# Patient Record
Sex: Male | Born: 1980 | Race: White | Hispanic: No | Smoking: Current every day smoker
Health system: Southern US, Community
[De-identification: ages and names within clinical notes are randomized; demographics above are authoritative.]

## PROBLEM LIST (undated history)

## (undated) DIAGNOSIS — J939 Pneumothorax, unspecified: Secondary | ICD-10-CM

## (undated) DIAGNOSIS — G43909 Migraine, unspecified, not intractable, without status migrainosus: Secondary | ICD-10-CM

## (undated) HISTORY — PX: OTHER SURGICAL HISTORY: SHX169

## (undated) HISTORY — PX: APPENDECTOMY: SHX54

---

## 2007-02-05 ENCOUNTER — Emergency Department (HOSPITAL_COMMUNITY): Admission: EM | Admit: 2007-02-05 | Discharge: 2007-02-05 | Payer: Self-pay | Admitting: Emergency Medicine

## 2007-02-07 ENCOUNTER — Emergency Department (HOSPITAL_COMMUNITY): Admission: EM | Admit: 2007-02-07 | Discharge: 2007-02-08 | Payer: Self-pay | Admitting: Emergency Medicine

## 2011-12-17 ENCOUNTER — Encounter (HOSPITAL_COMMUNITY): Payer: Self-pay | Admitting: Emergency Medicine

## 2011-12-17 ENCOUNTER — Emergency Department (HOSPITAL_COMMUNITY)
Admission: EM | Admit: 2011-12-17 | Discharge: 2011-12-18 | Disposition: A | Discharge status: Home / Self Care | Payer: Self-pay | Attending: Emergency Medicine | Admitting: Emergency Medicine

## 2011-12-17 DIAGNOSIS — R112 Nausea with vomiting, unspecified: Secondary | ICD-10-CM | POA: Insufficient documentation

## 2011-12-17 DIAGNOSIS — G43909 Migraine, unspecified, not intractable, without status migrainosus: Secondary | ICD-10-CM | POA: Insufficient documentation

## 2011-12-17 MED ORDER — PROMETHAZINE HCL 25 MG/ML IJ SOLN
25.0000 mg | Freq: Once | INTRAMUSCULAR | Status: AC
  Administered 2011-12-17: 25 mg via INTRAVENOUS
  Filled 2011-12-17: qty 1

## 2011-12-17 MED ORDER — DIPHENHYDRAMINE HCL 50 MG/ML IJ SOLN
25.0000 mg | Freq: Once | INTRAMUSCULAR | Status: AC
  Administered 2011-12-17: 25 mg via INTRAVENOUS
  Filled 2011-12-17: qty 1

## 2011-12-17 MED ORDER — SODIUM CHLORIDE 0.9 % IV BOLUS (SEPSIS)
2000.0000 mL | Freq: Once | INTRAVENOUS | Status: AC
  Administered 2011-12-17: 1000 mL via INTRAVENOUS

## 2011-12-17 MED ORDER — HYDROMORPHONE HCL PF 1 MG/ML IJ SOLN
1.0000 mg | Freq: Once | INTRAMUSCULAR | Status: AC
  Administered 2011-12-17: 1 mg via INTRAVENOUS
  Filled 2011-12-17: qty 1

## 2011-12-17 MED ORDER — METHYLPREDNISOLONE SODIUM SUCC 125 MG IJ SOLR
125.0000 mg | Freq: Once | INTRAMUSCULAR | Status: AC
  Administered 2011-12-17: 125 mg via INTRAVENOUS
  Filled 2011-12-17: qty 2

## 2011-12-17 NOTE — ED Provider Notes (Addendum)
History  This chart was scribed for Felisa Bonier, MD by Bennett Scrape. This patient was seen in room [facility] and the patient's care was started at 10:03PM.  CSN: [id number]  Arrival date & time 12/17/11  2058   First MD Initiated Contact with Patient 12/17/11 2150      Chief Complaint  Patient presents with  . Migraine  . Nausea    The history is provided by the patient. No language interpreter was used.    Mario Bautista is a 31 y.o. male with a h/o HA who presents to the Emergency Department complaining of 12 hours of gradual onset, gradually worsening, constant HA. The HA is described as a throbbing feeling and is located diffusely about the head. He lists nausea, emesis and dizziness with standing as associated symptoms. He states that the HA is worse with bright lights. He states that he has tried a plethora of medications at home with no improvement in symptoms. He denies head injury as the cause of the HA. He denies diarrhea, fever, abdominal pain, sore throat, cough and chest pain as associated symptoms. He has no other h/o chronic medical conditions. He is a current everyday smoker but denies alcohol use.   History reviewed. No pertinent past medical history.  History reviewed. No pertinent past surgical history.  No family history on file.  History  Substance Use Topics  . Smoking status: Current Everyday Smoker -- 0.5 packs/day for 15 years    Types: Cigarettes  . Smokeless tobacco: Not on file  . Alcohol Use: No      Review of Systems  Constitutional: Positive for chills. Negative for fever.  HENT: Negative for congestion, sore throat and neck pain.   Eyes: Positive for photophobia. Negative for pain.  Respiratory: Negative for cough and shortness of breath.   Cardiovascular: Negative for chest pain.  Gastrointestinal: Positive for nausea and vomiting. Negative for abdominal pain and diarrhea.  Genitourinary: Negative for dysuria, urgency and  hematuria.  Musculoskeletal: Negative for back pain.  Skin: Negative for rash.  Neurological: Positive for headaches. Negative for seizures.  Psychiatric/Behavioral: Negative for confusion.    Allergies  Penicillins and Imitrex  Home Medications  No current outpatient prescriptions on file.  Triage Vitals: BP 118/88  Pulse 97  Temp(Src) 98 F (36.7 C) (Oral)  Resp 20  Ht 6' (1.829 m)  Wt 135 lb (61.236 kg)  BMI 18.31 kg/m2  SpO2 99%  Physical Exam  Nursing note and vitals reviewed. Constitutional: He is oriented to person, place, and time. He appears well-developed and well-nourished. He appears distressed.  HENT:  Head: Normocephalic and atraumatic. Head is without raccoon's eyes, without Battle's sign, without abrasion, without contusion, without right periorbital erythema and without left periorbital erythema.  Right Ear: Hearing, tympanic membrane, external ear and ear canal normal.  Left Ear: Hearing, tympanic membrane, external ear and ear canal normal.  Nose: No mucosal edema, rhinorrhea, sinus tenderness, nasal deformity, septal deviation or nasal septal hematoma. No epistaxis. Right sinus exhibits no maxillary sinus tenderness and no frontal sinus tenderness. Left sinus exhibits no maxillary sinus tenderness and no frontal sinus tenderness.  Mouth/Throat: Uvula is midline, oropharynx is clear and moist and mucous membranes are normal. No oropharyngeal exudate.       Moist, normal oro  Eyes: Conjunctivae, EOM and lids are normal. Pupils are equal, round, and reactive to light. Right eye exhibits no chemosis, no discharge, no exudate and no hordeolum. No foreign body present in  the right eye. Left eye exhibits no chemosis, no discharge, no exudate and no hordeolum. No foreign body present in the left eye. Right conjunctiva is not injected. Right conjunctiva has no hemorrhage. Left conjunctiva is not injected. Left conjunctiva has no hemorrhage. Right eye exhibits normal  extraocular motion and no nystagmus. Left eye exhibits normal extraocular motion and no nystagmus.  Fundoscopic exam:      The right eye shows no AV nicking, no exudate, no hemorrhage and no papilledema.       The left eye shows no AV nicking, no exudate, no hemorrhage and no papilledema.       No papil edema  Neck: Trachea normal, normal range of motion, full passive range of motion without pain and phonation normal. Neck supple. No JVD present. No tracheal tenderness, no spinous process tenderness and no muscular tenderness present. Carotid bruit is not present. No rigidity. No tracheal deviation, no edema, no erythema and normal range of motion present. No Brudzinski's sign noted. No mass and no thyromegaly present.       No nuchal rigidity  Cardiovascular: Normal rate, regular rhythm, normal heart sounds and intact distal pulses.  Exam reveals no gallop and no friction rub.   No murmur heard. Pulmonary/Chest: Effort normal and breath sounds normal. No stridor. No respiratory distress. He has no wheezes. He has no rales.       Lungs are clear to ausculation, no rhonchi noted  Abdominal: Soft. Bowel sounds are normal. He exhibits no distension and no mass. There is no tenderness. There is no rebound and no guarding.  Musculoskeletal: Normal range of motion. He exhibits no edema and no tenderness.  Neurological: He is alert and oriented to person, place, and time. He has normal reflexes. He displays no atrophy, no tremor and normal reflexes. No cranial nerve deficit or sensory deficit. He exhibits normal muscle tone. He displays a negative Romberg sign. He displays no seizure activity. Coordination and gait normal. GCS eye subscore is 4. GCS verbal subscore is 5. GCS motor subscore is 6.  Reflex Scores:      Bicep reflexes are 2+ on the right side and 2+ on the left side.      Brachioradialis reflexes are 2+ on the right side and 2+ on the left side.      Patellar reflexes are 2+ on the right side  and 2+ on the left side.      Normal coordination with finger to nose to finger test  Skin: Skin is warm and dry. No rash noted. He is not diaphoretic. No erythema. No pallor.  Psychiatric: He has a normal mood and affect. His behavior is normal. Judgment and thought content normal.    ED Course  Procedures (including critical care time)  DIAGNOSTIC STUDIES: Oxygen Saturation is 99% on room air, normal by my interpretation.    COORDINATION OF CARE: 10:07PM-Discussed treatment plan with pt and pt agreed to plan.  Labs Reviewed - No data to display No results found.   No diagnosis found.    MDM  The patient has a gradual onset, generalized headache without focal neurologic symptoms or findings otherwise to suggest acute hemorrhage or mass lesion in the brain or to suggest need for CT scan of the head.  I will attempt to alleviate the patient's symptoms in the ED prior to discharge home.  The patient states his understanding of and agreement with the plan of care.      I personally performed the  services described in this documentation, which was scribed in my presence. The recorded information has been reviewed and considered.    Felisa Bonier, MD 12/17/11 2305  12:22 AM The patient reports no further nausea/vomiting, and that his headache is now an intensity of 1/10, all but gone.  He feels well enough for discharge home to rest.  Felisa Bonier, MD 12/18/11 [phone]

## 2011-12-17 NOTE — ED Notes (Signed)
Pt with cc of headache with nausea and chills starting this am

## 2011-12-18 MED ORDER — IBUPROFEN 800 MG PO TABS: 800.0000 mg | ORAL_TABLET | Freq: Three times a day (TID) | ORAL | Status: AC | PRN

## 2011-12-18 MED ORDER — PROMETHAZINE HCL 25 MG PO TABS: 25.0000 mg | ORAL_TABLET | Freq: Four times a day (QID) | ORAL | Status: DC | PRN

## 2011-12-18 NOTE — Discharge Instructions (Signed)
Headaches, Frequently Asked Questions MIGRAINE HEADACHES Q: What is migraine? What causes it? How can I treat it? A: Generally, migraine headaches begin as a dull ache. Then they develop into a constant, throbbing, and pulsating pain. You may experience pain at the temples. You may experience pain at the front or back of one or both sides of the head. The pain is usually accompanied by a combination of:  Nausea.   Vomiting.   Sensitivity to light and noise.  Some people (about 15%) experience an aura (see below) before an attack. The cause of migraine is believed to be chemical reactions in the brain. Treatment for migraine may include over-the-counter or prescription medications. It may also include self-help techniques. These include relaxation training and biofeedback.  Q: What is an aura? A: About 15% of people with migraine get an "aura". This is a sign of neurological symptoms that occur before a migraine headache. You may see wavy or jagged lines, dots, or flashing lights. You might experience tunnel vision or blind spots in one or both eyes. The aura can include visual or auditory hallucinations (something imagined). It may include disruptions in smell (such as strange odors), taste or touch. Other symptoms include:  Numbness.   A "pins and needles" sensation.   Difficulty in recalling or speaking the correct word.  These neurological events may last as long as 60 minutes. These symptoms will fade as the headache begins. Q: What is a trigger? A: Certain physical or environmental factors can lead to or "trigger" a migraine. These include:  Foods.   Hormonal changes.   Weather.   Stress.  It is important to remember that triggers are different for everyone. To help prevent migraine attacks, you need to figure out which triggers affect you. Keep a headache diary. This is a good way to track triggers. The diary will help you talk to your healthcare professional about your  condition. Q: Does weather affect migraines? A: Bright sunshine, hot, humid conditions, and drastic changes in barometric pressure may lead to, or "trigger," a migraine attack in some people. But studies have shown that weather does not act as a trigger for everyone with migraines. Q: What is the link between migraine and hormones? A: Hormones start and regulate many of your body's functions. Hormones keep your body in balance within a constantly changing environment. The levels of hormones in your body are unbalanced at times. Examples are during menstruation, pregnancy, or menopause. That can lead to a migraine attack. In fact, about three quarters of all women with migraine report that their attacks are related to the menstrual cycle.  Q: Is there an increased risk of stroke for migraine sufferers? A: The likelihood of a migraine attack causing a stroke is very remote. That is not to say that migraine sufferers cannot have a stroke associated with their migraines. In persons under age 40, the most common associated factor for stroke is migraine headache. But over the course of a person's normal life span, the occurrence of migraine headache may actually be associated with a reduced risk of dying from cerebrovascular disease due to stroke.  Q: What are acute medications for migraine? A: Acute medications are used to treat the pain of the headache after it has started. Examples over-the-counter medications, NSAIDs, ergots, and triptans.  Q: What are the triptans? A: Triptans are the newest class of abortive medications. They are specifically targeted to treat migraine. Triptans are vasoconstrictors. They moderate some chemical reactions in the brain.   The triptans work on receptors in your brain. Triptans help to restore the balance of a neurotransmitter called serotonin. Fluctuations in levels of serotonin are thought to be a main cause of migraine.  Q: Are over-the-counter medications for migraine  effective? A: Over-the-counter, or "OTC," medications may be effective in relieving mild to moderate pain and associated symptoms of migraine. But you should see your caregiver before beginning any treatment regimen for migraine.  Q: What are preventive medications for migraine? A: Preventive medications for migraine are sometimes referred to as "prophylactic" treatments. They are used to reduce the frequency, severity, and length of migraine attacks. Examples of preventive medications include antiepileptic medications, antidepressants, beta-blockers, calcium channel blockers, and NSAIDs (nonsteroidal anti-inflammatory drugs). Q: Why are anticonvulsants used to treat migraine? A: During the past few years, there has been an increased interest in antiepileptic drugs for the prevention of migraine. They are sometimes referred to as "anticonvulsants". Both epilepsy and migraine may be caused by similar reactions in the brain.  Q: Why are antidepressants used to treat migraine? A: Antidepressants are typically used to treat people with depression. They may reduce migraine frequency by regulating chemical levels, such as serotonin, in the brain.  Q: What alternative therapies are used to treat migraine? A: The term "alternative therapies" is often used to describe treatments considered outside the scope of conventional Western medicine. Examples of alternative therapy include acupuncture, acupressure, and yoga. Another common alternative treatment is herbal therapy. Some herbs are believed to relieve headache pain. Always discuss alternative therapies with your caregiver before proceeding. Some herbal products contain arsenic and other toxins. TENSION HEADACHES Q: What is a tension-type headache? What causes it? How can I treat it? A: Tension-type headaches occur randomly. They are often the result of temporary stress, anxiety, fatigue, or anger. Symptoms include soreness in your temples, a tightening  band-like sensation around your head (a "vice-like" ache). Symptoms can also include a pulling feeling, pressure sensations, and contracting head and neck muscles. The headache begins in your forehead, temples, or the back of your head and neck. Treatment for tension-type headache may include over-the-counter or prescription medications. Treatment may also include self-help techniques such as relaxation training and biofeedback. CLUSTER HEADACHES Q: What is a cluster headache? What causes it? How can I treat it? A: Cluster headache gets its name because the attacks come in groups. The pain arrives with little, if any, warning. It is usually on one side of the head. A tearing or bloodshot eye and a runny nose on the same side of the headache may also accompany the pain. Cluster headaches are believed to be caused by chemical reactions in the brain. They have been described as the most severe and intense of any headache type. Treatment for cluster headache includes prescription medication and oxygen. SINUS HEADACHES Q: What is a sinus headache? What causes it? How can I treat it? A: When a cavity in the bones of the face and skull (a sinus) becomes inflamed, the inflammation will cause localized pain. This condition is usually the result of an allergic reaction, a tumor, or an infection. If your headache is caused by a sinus blockage, such as an infection, you will probably have a fever. An x-ray will confirm a sinus blockage. Your caregiver's treatment might include antibiotics for the infection, as well as antihistamines or decongestants.  REBOUND HEADACHES Q: What is a rebound headache? What causes it? How can I treat it? A: A pattern of taking acute headache medications too   often can lead to a condition known as "rebound headache." A pattern of taking too much headache medication includes taking it more than 2 days per week or in excessive amounts. That means more than the label or a caregiver advises.  With rebound headaches, your medications not only stop relieving pain, they actually begin to cause headaches. Doctors treat rebound headache by tapering the medication that is being overused. Sometimes your caregiver will gradually substitute a different type of treatment or medication. Stopping may be a challenge. Regularly overusing a medication increases the potential for serious side effects. Consult a caregiver if you regularly use headache medications more than 2 days per week or more than the label advises. ADDITIONAL QUESTIONS AND ANSWERS Q: What is biofeedback? A: Biofeedback is a self-help treatment. Biofeedback uses special equipment to monitor your body's involuntary physical responses. Biofeedback monitors:  Breathing.   Pulse.   Heart rate.   Temperature.   Muscle tension.   Brain activity.  Biofeedback helps you refine and perfect your relaxation exercises. You learn to control the physical responses that are related to stress. Once the technique has been mastered, you do not need the equipment any more. Q: Are headaches hereditary? A: Four out of five (80%) of people that suffer report a family history of migraine. Scientists are not sure if this is genetic or a family predisposition. Despite the uncertainty, a child has a 50% chance of having migraine if one parent suffers. The child has a 75% chance if both parents suffer.  Q: Can children get headaches? A: By the time they reach high school, most young people have experienced some type of headache. Many safe and effective approaches or medications can prevent a headache from occurring or stop it after it has begun.  Q: What type of doctor should I see to diagnose and treat my headache? A: Start with your primary caregiver. Discuss his or her experience and approach to headaches. Discuss methods of classification, diagnosis, and treatment. Your caregiver may decide to recommend you to a headache specialist, depending upon  your symptoms or other physical conditions. Having diabetes, allergies, etc., may require a more comprehensive and inclusive approach to your headache. The National Headache Foundation will provide, upon request, a list of [facility] physician members in your state. Document Released: 11/23/2003 Document Revised: 08/22/2011 Document Reviewed: 05/02/2008 [facility] Patient Information 2012 [place], [place].Nausea and Vomiting Nausea is a sick feeling that often comes before throwing up (vomiting). Vomiting is a reflex where stomach contents come out of your mouth. Vomiting can cause severe loss of body fluids (dehydration). Children and elderly adults can become dehydrated quickly, especially if they also have diarrhea. Nausea and vomiting are symptoms of a condition or disease. It is important to find the cause of your symptoms. CAUSES   Direct irritation of the stomach lining. This irritation can result from increased acid production (gastroesophageal reflux disease), infection, food poisoning, taking certain medicines (such as nonsteroidal anti-inflammatory drugs), alcohol use, or tobacco use.   Signals from the brain.These signals could be caused by a headache, heat exposure, an inner ear disturbance, increased pressure in the brain from injury, infection, a tumor, or a concussion, pain, emotional stimulus, or metabolic problems.   An obstruction in the gastrointestinal tract (bowel obstruction).   Illnesses such as diabetes, hepatitis, gallbladder problems, appendicitis, kidney problems, cancer, sepsis, atypical symptoms of a heart attack, or eating disorders.   Medical treatments such as chemotherapy and radiation.   Receiving medicine that makes you  sleep (general anesthetic) during surgery.  DIAGNOSIS Your caregiver may ask for tests to be done if the problems do not improve after a few days. Tests may also be done if symptoms are severe or if the reason for the nausea and vomiting is not clear.  Tests may include:  Urine tests.   Blood tests.   Stool tests.   Cultures (to look for evidence of infection).   X-rays or other imaging studies.  Test results can help your caregiver make decisions about treatment or the need for additional tests. TREATMENT You need to stay well hydrated. Drink frequently but in small amounts.You may wish to drink water, sports drinks, clear broth, or eat frozen ice pops or gelatin dessert to help stay hydrated.When you eat, eating slowly may help prevent nausea.There are also some antinausea medicines that may help prevent nausea. HOME CARE INSTRUCTIONS   Take all medicine as directed by your caregiver.   If you do not have an appetite, do not force yourself to eat. However, you must continue to drink fluids.   If you have an appetite, eat a normal diet unless your caregiver tells you differently.   Eat a variety of complex carbohydrates (rice, wheat, potatoes, bread), lean meats, yogurt, fruits, and vegetables.   Avoid high-fat foods because they are more difficult to digest.   Drink enough water and fluids to keep your urine clear or pale yellow.   If you are dehydrated, ask your caregiver for specific rehydration instructions. Signs of dehydration may include:   Severe thirst.   Dry lips and mouth.   Dizziness.   Dark urine.   Decreasing urine frequency and amount.   Confusion.   Rapid breathing or pulse.  SEEK IMMEDIATE MEDICAL CARE IF:   You have blood or brown flecks (like coffee grounds) in your vomit.   You have black or bloody stools.   You have a severe headache or stiff neck.   You are confused.   You have severe abdominal pain.   You have chest pain or trouble breathing.   You do not urinate at least once every 8 hours.   You develop cold or clammy skin.   You continue to vomit for longer than 24 to 48 hours.   You have a fever.  MAKE SURE YOU:   Understand these instructions.   Will watch your  condition.   Will get help right away if you are not doing well or get worse.  Document Released: 09/02/2005 Document Revised: 08/22/2011 Document Reviewed: 01/30/2011 [facility] Patient Information 2012 [place], [place].Migraine Headache A migraine headache is an intense, throbbing pain on one or both sides of your head. The exact cause of a migraine headache is not always known. A migraine may be caused when nerves in the brain become irritated and release chemicals that cause swelling within blood vessels, causing pain. Many migraine sufferers have a family history of migraines. Before you get a migraine you may or may not get an aura. An aura is a group of symptoms that can predict the beginning of a migraine. An aura may include:  Visual changes such as:   Flashing lights.   Bright spots or zig-zag lines.   Tunnel vision.   Feelings of numbness.   Trouble talking.   Muscle weakness.  SYMPTOMS  Pain on one or both sides of your head.   Pain that is pulsating or throbbing in nature.   Pain that is severe enough to prevent daily activities.  Pain that is aggravated by any daily physical activity.   Nausea (feeling sick to your stomach), vomiting, or both.   Pain with exposure to bright lights, loud noises, or activity.   General sensitivity to bright lights or loud noises.  MIGRAINE TRIGGERS Examples of triggers of migraine headaches include:   Alcohol.   Smoking.   Stress.   It may be related to menses (male menstruation).   Aged cheeses.   Foods or drinks that contain nitrates, glutamate, aspartame, or tyramine.   Lack of sleep.   Chocolate.   Caffeine.   Hunger.   Medications such as nitroglycerine (used to treat chest pain), birth control pills, estrogen, and some blood pressure medications.  DIAGNOSIS  A migraine headache is often diagnosed based on:  Symptoms.   Physical examination.   A computerized X-ray scan (computed tomography, CT) of  your head.  TREATMENT  Medications can help prevent migraines if they are recurrent or should they become recurrent. Your caregiver can help you with a medication or treatment program that will be helpful to you.   Lying down in a dark, quiet room may be helpful.   Keeping a headache diary may help you find a trend as to what may be triggering your headaches.  SEEK IMMEDIATE MEDICAL CARE IF:   You have confusion, personality changes or seizures.   You have headaches that wake you from sleep.   You have an increased frequency in your headaches.   You have a stiff neck.   You have a loss of vision.   You have muscle weakness.   You start losing your balance or have trouble walking.   You feel faint or pass out.  MAKE SURE YOU:   Understand these instructions.   Will watch your condition.   Will get help right away if you are not doing well or get worse.  Document Released: 09/02/2005 Document Revised: 08/22/2011 Document Reviewed: 04/18/2009 [facility] Patient Information 2012 [place], [place].  RESOURCE GUIDE  Dental Problems  Patients with Medicaid: [facility] [phone] [address]                                           [phone] [address] Phone:  [phone]                                                  Phone:  [phone]  If unable to pay or uninsured, contact:  [facility] or [facility]. to become qualified for the adult dental clinic.  Chronic Pain Problems Contact [facility]  [phone] Patients need to be referred by their primary care doctor.  Insufficient Money for Medicine Contact [employer]:  call "211" or [facility] 289-465-0931.  No Primary Care Doctor Call [facility]  [phone] Other agencies that provide inexpensive medical care    [facility]  [phone]    [facility]  [phone]    [facility]  [phone]     [facility]  [phone]    [facility]  [phone]  [facility]  [phone]  Psychological Services [facility]  [phone] [facility]  [phone] [facility]   [phone] (emergency services [phone])  Substance Abuse Resources [facility]  3300197945 [facility] (351)423-1388 [facility] ([phone] Floydene Flock 330-830-7559 Residential & Outpatient Substance Abuse Program  [phone]  Abuse/Neglect [facility] Child Abuse Hotline 949-775-0797 [facility] Child Abuse Hotline (604) [phone] (After Hours)  Emergency Shelter [facility] 912-430-4505  [facility] 856-210-9676 [facility] ([phone]  MRSA Hotline #:   657-406-5383    [facility] Resources  [facility]     [employer]                          [facility] Dept. [address] [place]                       [address]      [address]  Blondell Reveal Phone:  [phone]                                   Phone:  [phone]                 Phone:  [phone]  [facility] Mental Health Phone:  [phone]  [facility] Child Abuse Hotline 2697826479 [phone] (After Hours)

## 2012-01-13 ENCOUNTER — Emergency Department (HOSPITAL_COMMUNITY): Payer: Self-pay

## 2012-01-13 ENCOUNTER — Encounter (HOSPITAL_COMMUNITY): Payer: Self-pay | Admitting: *Deleted

## 2012-01-13 ENCOUNTER — Inpatient Hospital Stay (HOSPITAL_COMMUNITY)
Admission: EM | Admit: 2012-01-13 | Discharge: 2012-01-14 | DRG: 343 | Disposition: A | Discharge status: Home / Self Care | Payer: Self-pay | Attending: General Surgery | Admitting: General Surgery

## 2012-01-13 DIAGNOSIS — R110 Nausea: Secondary | ICD-10-CM | POA: Diagnosis present

## 2012-01-13 DIAGNOSIS — K3580 Unspecified acute appendicitis: Principal | ICD-10-CM | POA: Diagnosis present

## 2012-01-13 DIAGNOSIS — R1031 Right lower quadrant pain: Secondary | ICD-10-CM | POA: Diagnosis present

## 2012-01-13 DIAGNOSIS — F17200 Nicotine dependence, unspecified, uncomplicated: Secondary | ICD-10-CM | POA: Diagnosis present

## 2012-01-13 HISTORY — DX: Pneumothorax, unspecified: J93.9

## 2012-01-13 LAB — BASIC METABOLIC PANEL
BUN: 9 mg/dL (ref 6–23)
CO2: 29 mEq/L (ref 19–32)
Calcium: 9.6 mg/dL (ref 8.4–10.5)
Glucose, Bld: 93 mg/dL (ref 70–99)
Potassium: 4 mEq/L (ref 3.5–5.1)
Sodium: 135 mEq/L (ref 135–145)

## 2012-01-13 LAB — DIFFERENTIAL
Eosinophils Absolute: 0.2 10*3/uL (ref 0.0–0.7)
Eosinophils Relative: 1 % (ref 0–5)
Lymphocytes Relative: 30 % (ref 12–46)
Lymphs Abs: 3.8 10*3/uL (ref 0.7–4.0)
Monocytes Relative: 9 % (ref 3–12)

## 2012-01-13 LAB — CBC
Hemoglobin: 15.5 g/dL (ref 13.0–17.0)
MCH: 31.4 pg (ref 26.0–34.0)
MCV: 89.5 fL (ref 78.0–100.0)
Platelets: 259 10*3/uL (ref 150–400)
RBC: 4.93 MIL/uL (ref 4.22–5.81)
WBC: 12.7 10*3/uL — ABNORMAL HIGH (ref 4.0–10.5)

## 2012-01-13 MED ORDER — SODIUM CHLORIDE 0.9 % IV BOLUS (SEPSIS)
1000.0000 mL | Freq: Once | INTRAVENOUS | Status: AC
  Administered 2012-01-13: 1000 mL via INTRAVENOUS

## 2012-01-13 MED ORDER — MORPHINE SULFATE 4 MG/ML IJ SOLN
4.0000 mg | Freq: Once | INTRAMUSCULAR | Status: AC
  Administered 2012-01-13: 4 mg via INTRAVENOUS
  Filled 2012-01-13: qty 1

## 2012-01-13 NOTE — ED Provider Notes (Signed)
History     CSN: [id number]  Arrival date & time 01/13/12  2124   First MD Initiated Contact with Patient 01/13/12 2325      Chief Complaint  Patient presents with  . Abdominal Pain    (Consider location/radiation/quality/duration/timing/severity/associated sxs/prior treatment) HPI Comments: 31 year old male with a history of pneumothorax in the past but no other medical problems, no surgical history presents with a complaint of right lower quadrant pain. This was acute in onset, I woke him from sleep at 2:00 in the morning 24 hours ago. The pain has been more or less persistent, gradually worsening and feels like an achy pain that is currently 8/10 in intensity. He denies associated urinary symptoms or change in bowel habits, has no fever, no nausea vomiting or blood in his stools. This pain does not radiate. It does seem to get worse with palpation of the belly. He has had loss of appetite today  Patient is a 31 y.o. male presenting with abdominal pain. The history is provided by the patient and the spouse.  Abdominal Pain The primary symptoms of the illness include abdominal pain.    Past Medical History  Diagnosis Date  . Pneumothorax     History reviewed. No pertinent past surgical history.  History reviewed. No pertinent family history.  History  Substance Use Topics  . Smoking status: Current Everyday Smoker -- 0.5 packs/day for 15 years    Types: Cigarettes  . Smokeless tobacco: Not on file  . Alcohol Use: No      Review of Systems  Gastrointestinal: Positive for abdominal pain.  All other systems reviewed and are negative.    Allergies  Penicillins and Imitrex  Home Medications   No current outpatient prescriptions on file.  BP 118/75  Pulse 73  Temp(Src) 97.2 F (36.2 C) (Oral)  Resp 18  Ht 6' (1.829 m)  Wt 140 lb (63.504 kg)  BMI 18.99 kg/m2  SpO2 97%  Physical Exam  Nursing note and vitals reviewed. Constitutional: He appears  well-developed and well-nourished. No distress.  HENT:  Head: Normocephalic and atraumatic.  Mouth/Throat: Oropharynx is clear and moist. No oropharyngeal exudate.  Eyes: Conjunctivae and EOM are normal. Pupils are equal, round, and reactive to light. Right eye exhibits no discharge. Left eye exhibits no discharge. No scleral icterus.  Neck: Normal range of motion. Neck supple. No JVD present. No thyromegaly present.  Cardiovascular: Normal rate, regular rhythm, normal heart sounds and intact distal pulses.  Exam reveals no gallop and no friction rub.   No murmur heard. Pulmonary/Chest: Effort normal and breath sounds normal. No respiratory distress. He has no wheezes. He has no rales.  Abdominal: Soft. Bowel sounds are normal. He exhibits no distension and no mass. There is tenderness ( focal right lower quadrant tenderness at McBurney's point and lower in the pelvis. Minimal right upper quadrant tenderness, no left-sided tenderness. There is a positive Rovsing sign, no psoas or obturator sign.).  Musculoskeletal: Normal range of motion. He exhibits no edema and no tenderness.  Lymphadenopathy:    He has no cervical adenopathy.  Neurological: He is alert. Coordination normal.  Skin: Skin is warm and dry. No rash noted. No erythema.  Psychiatric: He has a normal mood and affect. His behavior is normal.    ED Course  Procedures (including critical care time)  Labs Reviewed  CBC - Abnormal; Notable for the following:    WBC 12.7 (*)    All other components within normal limits  DIFFERENTIAL -  Abnormal; Notable for the following:    Monocytes Absolute 1.1 (*)    All other components within normal limits  BASIC METABOLIC PANEL  HEPATIC FUNCTION PANEL   Ct Abdomen Pelvis W Contrast  01/14/2012  *RADIOLOGY REPORT*  Clinical Data: Right lower quadrant pain  CT ABDOMEN AND PELVIS WITH CONTRAST  Technique:  Multidetector CT imaging of the abdomen and pelvis was performed following the standard  protocol during bolus administration of intravenous contrast.  Contrast: OMNIPAQUE IOHEXOL 300 MG/ML  SOLN  Comparison: None.  Findings: Limited images through the lung bases demonstrate no significant appreciable abnormality. The heart size is within normal limits. No pleural or pericardial effusion.  Unremarkable liver, biliary system, spleen, pancreas, adrenal glands, kidneys.  No hydronephrosis or hydroureter.  The appendix is distended, measuring 12 mm.  There is periappendiceal fat stranding.  No loculated fluid collection.  No free intraperitoneal air.  No bowel obstruction.  No lymphadenopathy.  Retroaortic left renal vein.  Normal caliber vasculature.  Thin-walled bladder.  No acute osseous abnormality.  IMPRESSION: Acute appendicitis.  Original Report Authenticated By: Waneta Martins, M.D.     No diagnosis found.    MDM  Patient has an exam consistent with appendicitis, white blood cell count is elevated at 12,700, CT scan pending, IV fluids, IV pain medications.  Patient reevaluated and still has ongoing right lower quadrant pain, intravenous hydromorphone given, ciprofloxacin ordered after review of the CT scan. The CT scan report shows acute appendicitis, I have discussed his care with the general surgeon on call who has accepted care, given orders to the nurse. Patient appears stable on transfer to the floor      Vida Roller, MD 01/14/12 [phone]

## 2012-01-13 NOTE — ED Notes (Signed)
RLQ Pain today,no nvd. No fever.

## 2012-01-14 ENCOUNTER — Encounter (HOSPITAL_COMMUNITY)
Admission: EM | Disposition: A | Discharge status: Home / Self Care | Payer: Self-pay | Source: Home / Self Care | Attending: General Surgery

## 2012-01-14 ENCOUNTER — Encounter (HOSPITAL_COMMUNITY): Payer: Self-pay | Admitting: Anesthesiology

## 2012-01-14 ENCOUNTER — Inpatient Hospital Stay (HOSPITAL_COMMUNITY): Payer: Self-pay | Admitting: Anesthesiology

## 2012-01-14 ENCOUNTER — Encounter (HOSPITAL_COMMUNITY): Payer: Self-pay | Admitting: *Deleted

## 2012-01-14 HISTORY — PX: LAPAROSCOPIC APPENDECTOMY: SHX408

## 2012-01-14 LAB — HEPATIC FUNCTION PANEL
AST: 13 U/L (ref 0–37)
Albumin: 4.2 g/dL (ref 3.5–5.2)
Alkaline Phosphatase: 87 U/L (ref 39–117)
Bilirubin, Direct: 0.1 mg/dL (ref 0.0–0.3)
Total Bilirubin: 0.6 mg/dL (ref 0.3–1.2)

## 2012-01-14 LAB — SURGICAL PCR SCREEN: Staphylococcus aureus: NEGATIVE

## 2012-01-14 SURGERY — APPENDECTOMY, LAPAROSCOPIC
Anesthesia: General | Site: Abdomen | Laterality: Right | Wound class: Contaminated

## 2012-01-14 MED ORDER — PROPOFOL 10 MG/ML IV EMUL
INTRAVENOUS | Status: DC | PRN
  Administered 2012-01-14: 150 mg via INTRAVENOUS

## 2012-01-14 MED ORDER — FENTANYL CITRATE 0.05 MG/ML IJ SOLN
INTRAMUSCULAR | Status: AC
  Administered 2012-01-14: 50 ug via INTRAVENOUS
  Filled 2012-01-14: qty 2

## 2012-01-14 MED ORDER — ROCURONIUM BROMIDE 100 MG/10ML IV SOLN
INTRAVENOUS | Status: DC | PRN
  Administered 2012-01-14: 30 mg via INTRAVENOUS
  Administered 2012-01-14: 10 mg via INTRAVENOUS

## 2012-01-14 MED ORDER — ROCURONIUM BROMIDE 50 MG/5ML IV SOLN
INTRAVENOUS | Status: AC
  Filled 2012-01-14: qty 1

## 2012-01-14 MED ORDER — METRONIDAZOLE IN NACL 5-0.79 MG/ML-% IV SOLN
500.0000 mg | Freq: Three times a day (TID) | INTRAVENOUS | Status: DC
  Administered 2012-01-14 (×2): 500 mg via INTRAVENOUS
  Filled 2012-01-14 (×2): qty 100

## 2012-01-14 MED ORDER — IOHEXOL 300 MG/ML  SOLN
100.0000 mL | Freq: Once | INTRAMUSCULAR | Status: AC | PRN
  Administered 2012-01-14: 100 mL via INTRAVENOUS

## 2012-01-14 MED ORDER — ONDANSETRON HCL 4 MG/2ML IJ SOLN
4.0000 mg | Freq: Four times a day (QID) | INTRAMUSCULAR | Status: DC | PRN
  Administered 2012-01-14: 4 mg via INTRAVENOUS

## 2012-01-14 MED ORDER — PNEUMOCOCCAL VAC POLYVALENT 25 MCG/0.5ML IJ INJ
0.5000 mL | INJECTION | Freq: Once | INTRAMUSCULAR | Status: AC
  Administered 2012-01-14: 0.5 mL via INTRAMUSCULAR
  Filled 2012-01-14: qty 0.5

## 2012-01-14 MED ORDER — BUPIVACAINE HCL 0.5 % IJ SOLN
INTRAMUSCULAR | Status: DC | PRN
  Administered 2012-01-14: 10 mL

## 2012-01-14 MED ORDER — FENTANYL CITRATE 0.05 MG/ML IJ SOLN
25.0000 ug | INTRAMUSCULAR | Status: DC | PRN
  Administered 2012-01-14 (×2): 50 ug via INTRAVENOUS

## 2012-01-14 MED ORDER — METRONIDAZOLE IN NACL 5-0.79 MG/ML-% IV SOLN
INTRAVENOUS | Status: AC
  Filled 2012-01-14: qty 100

## 2012-01-14 MED ORDER — MIDAZOLAM HCL 2 MG/2ML IJ SOLN
1.0000 mg | INTRAMUSCULAR | Status: DC | PRN
  Administered 2012-01-14: 2 mg via INTRAVENOUS

## 2012-01-14 MED ORDER — BUPIVACAINE HCL (PF) 0.5 % IJ SOLN
INTRAMUSCULAR | Status: AC
  Filled 2012-01-14: qty 30

## 2012-01-14 MED ORDER — ONDANSETRON HCL 4 MG/2ML IJ SOLN
INTRAMUSCULAR | Status: AC
  Administered 2012-01-14: 4 mg via INTRAVENOUS
  Filled 2012-01-14: qty 2

## 2012-01-14 MED ORDER — PNEUMOCOCCAL VAC POLYVALENT 25 MCG/0.5ML IJ INJ
0.5000 mL | INJECTION | INTRAMUSCULAR | Status: DC
  Filled 2012-01-14: qty 0.5

## 2012-01-14 MED ORDER — HYDROMORPHONE HCL PF 1 MG/ML IJ SOLN
1.0000 mg | Freq: Once | INTRAMUSCULAR | Status: AC
  Administered 2012-01-14: 1 mg via INTRAVENOUS
  Filled 2012-01-14: qty 1

## 2012-01-14 MED ORDER — NEOSTIGMINE METHYLSULFATE 1 MG/ML IJ SOLN
INTRAMUSCULAR | Status: DC | PRN
  Administered 2012-01-14: 2 mg via INTRAVENOUS

## 2012-01-14 MED ORDER — GLYCOPYRROLATE 0.2 MG/ML IJ SOLN
INTRAMUSCULAR | Status: DC | PRN
  Administered 2012-01-14: 0.4 mg via INTRAVENOUS

## 2012-01-14 MED ORDER — HYDROCODONE-ACETAMINOPHEN 5-325 MG PO TABS
1.0000 | ORAL_TABLET | ORAL | Status: DC | PRN
  Administered 2012-01-14: 2 via ORAL
  Filled 2012-01-14: qty 2

## 2012-01-14 MED ORDER — PROPOFOL 10 MG/ML IV EMUL
INTRAVENOUS | Status: AC
  Filled 2012-01-14: qty 20

## 2012-01-14 MED ORDER — ENOXAPARIN SODIUM 40 MG/0.4ML ~~LOC~~ SOLN
40.0000 mg | SUBCUTANEOUS | Status: DC
  Administered 2012-01-14: 40 mg via SUBCUTANEOUS
  Filled 2012-01-14: qty 0.4

## 2012-01-14 MED ORDER — SODIUM CHLORIDE 0.9 % IR SOLN
Status: DC | PRN
  Administered 2012-01-14: 1000 mL

## 2012-01-14 MED ORDER — HYDROCODONE-ACETAMINOPHEN 5-325 MG PO TABS: 1.0000 | ORAL_TABLET | ORAL | Status: AC | PRN

## 2012-01-14 MED ORDER — CIPROFLOXACIN IN D5W 400 MG/200ML IV SOLN
400.0000 mg | Freq: Once | INTRAVENOUS | Status: AC
  Administered 2012-01-14: 400 mg via INTRAVENOUS
  Filled 2012-01-14: qty 200

## 2012-01-14 MED ORDER — ONDANSETRON HCL 4 MG/2ML IJ SOLN: 4.0000 mg | Freq: Once | INTRAMUSCULAR | Status: DC | PRN

## 2012-01-14 MED ORDER — LACTATED RINGERS IV SOLN
INTRAVENOUS | Status: DC
  Administered 2012-01-14: 04:00:00 via INTRAVENOUS

## 2012-01-14 MED ORDER — HYDROMORPHONE HCL PF 1 MG/ML IJ SOLN
1.0000 mg | INTRAMUSCULAR | Status: DC | PRN
  Administered 2012-01-14 (×2): 2 mg via INTRAVENOUS
  Filled 2012-01-14: qty 2

## 2012-01-14 MED ORDER — CIPROFLOXACIN IN D5W 400 MG/200ML IV SOLN
400.0000 mg | Freq: Two times a day (BID) | INTRAVENOUS | Status: DC
  Filled 2012-01-14: qty 200

## 2012-01-14 MED ORDER — HYDROMORPHONE HCL PF 1 MG/ML IJ SOLN
1.0000 mg | INTRAMUSCULAR | Status: DC | PRN
  Administered 2012-01-14: 2 mg via INTRAVENOUS
  Filled 2012-01-14 (×2): qty 2

## 2012-01-14 MED ORDER — GLYCOPYRROLATE 0.2 MG/ML IJ SOLN
INTRAMUSCULAR | Status: AC
  Filled 2012-01-14: qty 2

## 2012-01-14 MED ORDER — FENTANYL CITRATE 0.05 MG/ML IJ SOLN
INTRAMUSCULAR | Status: DC | PRN
  Administered 2012-01-14 (×2): 50 ug via INTRAVENOUS

## 2012-01-14 MED ORDER — LACTATED RINGERS IV SOLN
INTRAVENOUS | Status: DC
  Administered 2012-01-14: 1000 mL via INTRAVENOUS

## 2012-01-14 MED ORDER — MIDAZOLAM HCL 2 MG/2ML IJ SOLN
INTRAMUSCULAR | Status: AC
  Administered 2012-01-14: 2 mg via INTRAVENOUS
  Filled 2012-01-14: qty 2

## 2012-01-14 MED ORDER — ONDANSETRON HCL 4 MG/2ML IJ SOLN
INTRAMUSCULAR | Status: AC
  Filled 2012-01-14: qty 2

## 2012-01-14 MED ORDER — NICOTINE 21 MG/24HR TD PT24
21.0000 mg | MEDICATED_PATCH | Freq: Every day | TRANSDERMAL | Status: DC
  Administered 2012-01-14: 21 mg via TRANSDERMAL
  Filled 2012-01-14: qty 1

## 2012-01-14 MED ORDER — ONDANSETRON HCL 4 MG/2ML IJ SOLN
4.0000 mg | Freq: Once | INTRAMUSCULAR | Status: AC
  Administered 2012-01-14: 4 mg via INTRAVENOUS

## 2012-01-14 MED ORDER — HYDROMORPHONE HCL PF 1 MG/ML IJ SOLN
INTRAMUSCULAR | Status: AC
  Administered 2012-01-14: 2 mg via INTRAVENOUS
  Filled 2012-01-14: qty 2

## 2012-01-14 SURGICAL SUPPLY — 43 items
BAG HAMPER (MISCELLANEOUS) ×2 IMPLANT
BENZOIN TINCTURE PRP APPL 2/3 (GAUZE/BANDAGES/DRESSINGS) ×2 IMPLANT
CLOTH BEACON ORANGE TIMEOUT ST (SAFETY) ×2 IMPLANT
COVER SURGICAL LIGHT HANDLE (MISCELLANEOUS) ×4 IMPLANT
CUTTER ENDO LINEAR 45M (STAPLE) ×2 IMPLANT
DECANTER SPIKE VIAL GLASS SM (MISCELLANEOUS) ×2 IMPLANT
DEVICE TROCAR PUNCTURE CLOSURE (ENDOMECHANICALS) ×2 IMPLANT
DISSECTOR BLUNT TIP ENDO 5MM (MISCELLANEOUS) IMPLANT
DURAPREP 26ML APPLICATOR (WOUND CARE) ×2 IMPLANT
ELECT REM PT RETURN 9FT ADLT (ELECTROSURGICAL) ×2
ELECTRODE REM PT RTRN 9FT ADLT (ELECTROSURGICAL) ×1 IMPLANT
FILTER SMOKE EVAC LAPAROSHD (FILTER) ×2 IMPLANT
FORMALIN 10 PREFIL 120ML (MISCELLANEOUS) ×2 IMPLANT
GLOVE BIOGEL PI IND STRL 7.0 (GLOVE) ×1 IMPLANT
GLOVE BIOGEL PI IND STRL 7.5 (GLOVE) ×1 IMPLANT
GLOVE BIOGEL PI INDICATOR 7.0 (GLOVE) ×1
GLOVE BIOGEL PI INDICATOR 7.5 (GLOVE) ×1
GLOVE ECLIPSE 6.5 STRL STRAW (GLOVE) ×2 IMPLANT
GLOVE ECLIPSE 7.0 STRL STRAW (GLOVE) ×2 IMPLANT
GLOVE EXAM NITRILE MD LF STRL (GLOVE) ×2 IMPLANT
GOWN STRL REIN XL XLG (GOWN DISPOSABLE) ×4 IMPLANT
INST SET LAPROSCOPIC AP (KITS) ×2 IMPLANT
KIT ROOM TURNOVER APOR (KITS) ×2 IMPLANT
LIGASURE 5MM LAPAROSCOPIC (INSTRUMENTS) ×2 IMPLANT
MANIFOLD NEPTUNE II (INSTRUMENTS) ×2 IMPLANT
NEEDLE INSUFFLATION 14GA 120MM (NEEDLE) ×2 IMPLANT
NS IRRIG 1000ML POUR BTL (IV SOLUTION) ×2 IMPLANT
PACK LAP CHOLE LZT030E (CUSTOM PROCEDURE TRAY) ×2 IMPLANT
PAD ARMBOARD 7.5X6 YLW CONV (MISCELLANEOUS) ×2 IMPLANT
POUCH SPECIMEN RETRIEVAL 10MM (ENDOMECHANICALS) ×2 IMPLANT
RELOAD 45 VASCULAR/THIN (ENDOMECHANICALS) IMPLANT
RELOAD STAPLE TA45 3.5 REG BLU (ENDOMECHANICALS) ×2 IMPLANT
SET BASIN LINEN APH (SET/KITS/TRAYS/PACK) ×2 IMPLANT
SET TUBE IRRIG SUCTION NO TIP (IRRIGATION / IRRIGATOR) IMPLANT
SLEEVE Z-THREAD 5X100MM (TROCAR) ×2 IMPLANT
STRIP CLOSURE SKIN 1/2X4 (GAUZE/BANDAGES/DRESSINGS) ×2 IMPLANT
SUT MNCRL AB 4-0 PS2 18 (SUTURE) ×2 IMPLANT
SUT VIC AB 2-0 CT2 27 (SUTURE) ×4 IMPLANT
TRAY FOLEY CATH 14FR (SET/KITS/TRAYS/PACK) ×2 IMPLANT
TROCAR Z-THRD FIOS HNDL 11X100 (TROCAR) ×2 IMPLANT
TROCAR Z-THREAD FIOS 5X100MM (TROCAR) ×2 IMPLANT
TROCAR Z-THREAD SLEEVE 11X100 (TROCAR) ×2 IMPLANT
WARMER LAPAROSCOPE (MISCELLANEOUS) ×2 IMPLANT

## 2012-01-14 NOTE — Progress Notes (Signed)
Patient discharged home via family; Pt given and explained discharge instructions, prescriptions, carenotes; stated understanding and denied questions; pts IV removed without problems; pt stable at time of discharge   

## 2012-01-14 NOTE — Anesthesia Postprocedure Evaluation (Signed)
Anesthesia Post Note  Patient: Mario Bautista  Procedure(s) Performed: Procedure(s) (LRB): APPENDECTOMY LAPAROSCOPIC (Right)  Anesthesia type: General  Patient location: PACU  Post pain: Pain level controlled  Post assessment: Post-op Vital signs reviewed, Patient's Cardiovascular Status Stable, Respiratory Function Stable, Patent Airway, No signs of Nausea or vomiting and Pain level controlled  Last Vitals:  Filed Vitals:   01/14/12 1331  BP: 117/72  Pulse: 76  Temp: 36.4 C  Resp: 18    Post vital signs: Reviewed and stable  Level of consciousness: awake and alert   Complications: No apparent anesthesia complications

## 2012-01-14 NOTE — Progress Notes (Signed)
   CARE MANAGEMENT NOTE 01/14/2012  Patient:  Mario Bautista, Mario Bautista   Account Number:  [id number]  Date Initiated:  01/14/2012  Documentation initiated by:  Sharrie Rothman  Subjective/Objective Assessment:   Pt admitted from home with wife with appendicitis. Pt independent with ADL's PTA. Pt to have surgery today and possible discharge later this pm.     Action/Plan:   Will assess pt CM needs once returns from surgery. May need help with medications. Financial counselor aware of pt being self pay.   Anticipated DC Date:  01/20/2012   Anticipated DC Plan:  HOME/SELF CARE  In-house referral  Financial Counselor      DC Planning Services  CM consult      Choice offered to / List presented to:             Status of service:  In process, will continue to follow Medicare Important Message given?   (If response is "NO", the following Medicare IM given date fields will be blank) Date Medicare IM given:   Date Additional Medicare IM given:    Discharge Disposition:  HOME/SELF CARE  Per UR Regulation:    If discussed at Long Length of Stay Meetings, dates discussed:    Comments:  01/14/12 1112 Arlyss Queen, RN BSN CM

## 2012-01-14 NOTE — Anesthesia Procedure Notes (Signed)
Procedure Name: Intubation Date/Time: 01/14/2012 12:42 PM Performed by: Franco Nones Pre-anesthesia Checklist: Patient identified, Patient being monitored, Timeout performed, Emergency Drugs available and Suction available Patient Re-evaluated:Patient Re-evaluated prior to inductionOxygen Delivery Method: Circle System Utilized Preoxygenation: Pre-oxygenation with 100% oxygen Intubation Type: IV induction, Rapid sequence and Cricoid Pressure applied Laryngoscope Size: Miller and 2 Grade View: Grade I Tube type: Oral Tube size: 7.0 mm Number of attempts: 1 Airway Equipment and Method: stylet Placement Confirmation: ETT inserted through vocal cords under direct vision,  positive ETCO2 and breath sounds checked- equal and bilateral Secured at: 21 cm Tube secured with: Tape Dental Injury: Teeth and Oropharynx as per pre-operative assessment

## 2012-01-14 NOTE — Transfer of Care (Signed)
Immediate Anesthesia Transfer of Care Note  Patient: Mario Bautista  Procedure(s) Performed: Procedure(s) (LRB): APPENDECTOMY LAPAROSCOPIC (Right)  Patient Location: PACU  Anesthesia Type: General  Level of Consciousness: awake  Airway & Oxygen Therapy: Patient Spontanous Breathing and non-rebreather face mask  Post-op Assessment: Report given to PACU RN, Post -op Vital signs reviewed and stable and Patient moving all extremities  Post vital signs: Reviewed and stable  Complications: No apparent anesthesia complications

## 2012-01-14 NOTE — Op Note (Signed)
Patient:  Mario Bautista  DOB:  [birth year]  MRN:  [id number]   Preop Diagnosis:  Acute appendicitis  Postop Diagnosis:  The same  Procedure:  Laparoscopic appendectomy  Surgeon:  Dr. Tilford Pillar  Anes:  General endotracheal  Indications:  Patient is a 31 year old male presented to [facility] with a history of right lower quadrant abdominal pain. Workup and evaluation was consistent for acute appendicitis. Risks benefits alternatives a laparoscopic possible open appendectomy were discussed at length the patient including but not limited to risk of bleeding, infection, appendiceal stump leak, intraoperative cardiac and pulmonary events. Patient's questions and concerns were addressed the patient was consented for the planned procedure.  Procedure note:  Patient is taken to the or is placed in a supine position on the OR table. At this point general anesthetic is administered and was patient was asleep he was endotracheally intubated by the nurse anesthetist. At this point a Foley catheter is placed in standard sterile fashion by the operative staff. His abdomen is prepped with DuraPrep solution and draped in standard fashion. A stab incision was created supraumbilically with 11 blade scalpel. Additional dissection down to subcuticular tissues carried using a Coker clamp was utilized grasp the anterior bowel wall fascia and lift his anteriorly. A Veress needle is inserted saline drop as utilized confirm intraperitoneal placement the pneumoperitoneum was initiated. Once sufficient pneumoperitoneum was obtained an 11 mm was inserted over laparoscopic line visualization the trocar entering the peritoneal cavity. At this point the inner cannulas removed the laparoscope was reinserted, there is no evidence of any trocar or Veress needle placement injury. At this time the remaining trochars replaced with a 5 mm car in the suprapubic region and a 11 mm in the left lateral abdominal wall. Patient's  placed into a Trendelenburg left lateral decubitus position. The tinea the cecum or fall down to the base of the appendix. The appendix is grasped and lifted anteriorly. A window was created between the appendix and mesoappendix at the base of the appendix. The mesoappendix divided using the LigaSure. At this point the base of the appendix is divided using a Endo GIA 35 stapler with a standard load. The appendix is placed into an Endo Catch bag and placed up into the right upper quadrant. Inspection of the staple line and the mesoappendix indicated excellent closure and hemostasis. At this time attention was turned to closure.  Using an Endo Close suture passing device a 2-0 Vicryl suture was passed both 11 mm car sites. With the sutures in place the appendix was retrieved through the umbilical trocar site and intact Endo Catch bag. At this point the pneumoperitoneum was evacuated. The Vicryl sutures were secured. The local anesthetic is instilled. A 4-0 Monocryl utilized reapproximate the skin edges at all 3 trocar sites. The skin was washed dried moist dry towel. Benzoin is applied around incision. Half-inch Steri-Strips are placed. The drapes removed the patient was allowed to mild general anesthetic and stretcher the PACU in stable condition. At the conclusion of procedure all instrument, sponge, needle counts are correct. Patient tolerated procedure extremely well.  Complications:  None  EBL:  Scant  Specimen:  Appendix

## 2012-01-14 NOTE — H&P (Signed)
Mario Bautista is an 31 y.o. male.   Chief Complaint: RLQ Pain HPI: Patient presents to [facility] ED with less than 24 hours of RLQ pain.  Pain started insidiously and localized to the RLQ.  Pain worse with movement.  Assoc. Chills and nausea.  No emesis.  NO similar symptoms in the past.    Past Medical History  Diagnosis Date  . Pneumothorax     History reviewed. No pertinent past surgical history.  History reviewed. No pertinent family history. Social History:  reports that he has been smoking Cigarettes.  He has a 7.5 pack-year smoking history. He does not have any smokeless tobacco history on file. He reports that he does not drink alcohol or use illicit drugs.  Allergies:  Allergies  Allergen Reactions  . Penicillins Swelling  . Imitrex (Sumatriptan Base)     Causes patient to become "mean"    Medications Prior to Admission  Medication Sig Dispense Refill  . promethazine (PHENERGAN) 25 MG tablet Take 1 tablet (25 mg total) by mouth every 6 (six) hours as needed for nausea (headache).  12 tablet  0    Results for orders placed during the hospital encounter of 01/13/12 (from the past 48 hour(s))  CBC     Status: Abnormal   Collection Time   01/13/12 10:12 PM      Component Value Range Comment   WBC 12.7 (*) 4.0 - 10.5 (K/uL)    RBC 4.93  4.22 - 5.81 (MIL/uL)    Hemoglobin 15.5  13.0 - 17.0 (g/dL)    HCT 45.4  09.8 - 11.9 (%)    MCV 89.5  78.0 - 100.0 (fL)    MCH 31.4  26.0 - 34.0 (pg)    MCHC 35.1  30.0 - 36.0 (g/dL)    RDW 14.7  82.9 - 56.2 (%)    Platelets 259  150 - 400 (K/uL)   DIFFERENTIAL     Status: Abnormal   Collection Time   01/13/12 10:12 PM      Component Value Range Comment   Neutrophils Relative 60  43 - 77 (%)    Neutro Abs 7.6  1.7 - 7.7 (K/uL)    Lymphocytes Relative 30  12 - 46 (%)    Lymphs Abs 3.8  0.7 - 4.0 (K/uL)    Monocytes Relative 9  3 - 12 (%)    Monocytes Absolute 1.1 (*) 0.1 - 1.0 (K/uL)    Eosinophils Relative 1  0 - 5 (%)    Eosinophils  Absolute 0.2  0.0 - 0.7 (K/uL)    Basophils Relative 0  0 - 1 (%)    Basophils Absolute 0.1  0.0 - 0.1 (K/uL)   BASIC METABOLIC PANEL     Status: Normal   Collection Time   01/13/12 10:12 PM      Component Value Range Comment   Sodium 135  135 - 145 (mEq/L)    Potassium 4.0  3.5 - 5.1 (mEq/L)    Chloride 98  96 - 112 (mEq/L)    CO2 29  19 - 32 (mEq/L)    Glucose, Bld 93  70 - 99 (mg/dL)    BUN 9  6 - 23 (mg/dL)    Creatinine, Ser 1.30  0.50 - 1.35 (mg/dL)    Calcium 9.6  8.4 - 10.5 (mg/dL)    GFR calc non Af Amer >90  >90 (mL/min)    GFR calc Af Amer >90  >90 (mL/min)   HEPATIC FUNCTION  PANEL     Status: Normal   Collection Time   01/13/12 11:32 PM      Component Value Range Comment   Total Protein 7.1  6.0 - 8.3 (g/dL)    Albumin 4.2  3.5 - 5.2 (g/dL)    AST 13  0 - 37 (U/L)    ALT 11  0 - 53 (U/L)    Alkaline Phosphatase 87  39 - 117 (U/L)    Total Bilirubin 0.6  0.3 - 1.2 (mg/dL)    Bilirubin, Direct 0.1  0.0 - 0.3 (mg/dL)    Indirect Bilirubin 0.5  0.3 - 0.9 (mg/dL)    Ct Abdomen Pelvis W Contrast  01/14/2012  *RADIOLOGY REPORT*  Clinical Data: Right lower quadrant pain  CT ABDOMEN AND PELVIS WITH CONTRAST  Technique:  Multidetector CT imaging of the abdomen and pelvis was performed following the standard protocol during bolus administration of intravenous contrast.  Contrast: OMNIPAQUE IOHEXOL 300 MG/ML  SOLN  Comparison: None.  Findings: Limited images through the lung bases demonstrate no significant appreciable abnormality. The heart size is within normal limits. No pleural or pericardial effusion.  Unremarkable liver, biliary system, spleen, pancreas, adrenal glands, kidneys.  No hydronephrosis or hydroureter.  The appendix is distended, measuring 12 mm.  There is periappendiceal fat stranding.  No loculated fluid collection.  No free intraperitoneal air.  No bowel obstruction.  No lymphadenopathy.  Retroaortic left renal vein.  Normal caliber vasculature.  Thin-walled  bladder.  No acute osseous abnormality.  IMPRESSION: Acute appendicitis.  Original Report Authenticated By: Waneta Martins, M.D.    Review of Systems  Constitutional: Positive for fever and chills.  HENT: Negative.   Eyes: Negative.   Respiratory: Negative.   Cardiovascular: Negative.   Gastrointestinal: Positive for nausea and abdominal pain (RLQ). Negative for vomiting, diarrhea, constipation and blood in stool.  Genitourinary: Negative.   Skin: Negative.   Neurological: Negative.   Endo/Heme/Allergies: Negative.   Psychiatric/Behavioral: Negative.     Blood pressure 118/75, pulse 73, temperature 97.2 F (36.2 C), temperature source Oral, resp. rate 18, height 6' (1.829 m), weight 63.504 kg (140 lb), SpO2 97.00%. Physical Exam  Constitutional: He is oriented to person, place, and time. He appears well-developed and well-nourished. No distress.       thin  HENT:  Head: Normocephalic and atraumatic.  Eyes: Conjunctivae and EOM are normal. Pupils are equal, round, and reactive to light. No scleral icterus.  Neck: Normal range of motion. Neck supple. No tracheal deviation present. No thyromegaly present.  Cardiovascular: Normal rate, regular rhythm and normal heart sounds.   Respiratory: Effort normal and breath sounds normal. No respiratory distress. He has no wheezes.  GI: Soft. He exhibits no distension and no mass. There is tenderness (RLQ pain.  + Mcburney;s point tenderness. + Rovsings.). There is rebound. There is no guarding.       flat  Musculoskeletal: Normal range of motion.  Lymphadenopathy:    He has no cervical adenopathy.  Neurological: He is alert and oriented to person, place, and time.  Skin: Skin is warm and dry.     Assessment/Plan Acute Appendicitis.  Continue IV abx.  Continue NPO.  R/B/A of surgical intervention discussed.  Will proceed to OR.  Malachi Kinzler C 01/14/2012, 9:45 AM

## 2012-01-14 NOTE — Anesthesia Preprocedure Evaluation (Signed)
Anesthesia Evaluation  Patient identified by MRN, date of birth, ID band Patient awake    Reviewed: Allergy & Precautions, H&P , NPO status , Patient's Chart, lab work & pertinent test results  Airway Mallampati: I TM Distance: >3 FB     Dental  (+) Teeth Intact   Pulmonary neg pulmonary ROS,  breath sounds clear to auscultation        Cardiovascular negative cardio ROS  Rhythm:Regular Rate:Normal     Neuro/Psych    GI/Hepatic GERD-  ,  Endo/Other    Renal/GU      Musculoskeletal   Abdominal   Peds  Hematology   Anesthesia Other Findings   Reproductive/Obstetrics                           Anesthesia Physical Anesthesia Plan  ASA: II  Anesthesia Plan: General   Post-op Pain Management:    Induction: Intravenous, Rapid sequence and Cricoid pressure planned  Airway Management Planned: Oral ETT  Additional Equipment:   Intra-op Plan:   Post-operative Plan: Extubation in OR  Informed Consent: I have reviewed the patients History and Physical, chart, labs and discussed the procedure including the risks, benefits and alternatives for the proposed anesthesia with the patient or authorized representative who has indicated his/her understanding and acceptance.     Plan Discussed with:   Anesthesia Plan Comments:         Anesthesia Quick Evaluation  

## 2012-01-16 ENCOUNTER — Encounter (HOSPITAL_COMMUNITY): Payer: Self-pay | Admitting: General Surgery

## 2012-01-30 NOTE — Progress Notes (Signed)
UR Chart Review Completed  

## 2012-01-31 ENCOUNTER — Emergency Department (HOSPITAL_COMMUNITY): Payer: Self-pay

## 2012-01-31 ENCOUNTER — Encounter (HOSPITAL_COMMUNITY): Payer: Self-pay | Admitting: *Deleted

## 2012-01-31 ENCOUNTER — Emergency Department (HOSPITAL_COMMUNITY)
Admission: EM | Admit: 2012-01-31 | Discharge: 2012-01-31 | Disposition: A | Discharge status: Home / Self Care | Payer: Self-pay | Attending: Emergency Medicine | Admitting: Emergency Medicine

## 2012-01-31 DIAGNOSIS — F17200 Nicotine dependence, unspecified, uncomplicated: Secondary | ICD-10-CM | POA: Insufficient documentation

## 2012-01-31 DIAGNOSIS — R112 Nausea with vomiting, unspecified: Secondary | ICD-10-CM | POA: Insufficient documentation

## 2012-01-31 DIAGNOSIS — N200 Calculus of kidney: Secondary | ICD-10-CM | POA: Insufficient documentation

## 2012-01-31 DIAGNOSIS — N289 Disorder of kidney and ureter, unspecified: Secondary | ICD-10-CM | POA: Insufficient documentation

## 2012-01-31 DIAGNOSIS — R1084 Generalized abdominal pain: Secondary | ICD-10-CM | POA: Insufficient documentation

## 2012-01-31 HISTORY — DX: Migraine, unspecified, not intractable, without status migrainosus: G43.909

## 2012-01-31 LAB — COMPREHENSIVE METABOLIC PANEL
ALT: 37 U/L (ref 0–53)
Alkaline Phosphatase: 108 U/L (ref 39–117)
BUN: 45 mg/dL — ABNORMAL HIGH (ref 6–23)
CO2: 23 mEq/L (ref 19–32)
GFR calc Af Amer: 41 mL/min — ABNORMAL LOW (ref >90)
GFR calc non Af Amer: 35 mL/min — ABNORMAL LOW (ref >90)
Glucose, Bld: 101 mg/dL — ABNORMAL HIGH (ref 70–99)
Potassium: 4.1 mEq/L (ref 3.5–5.1)
Total Bilirubin: 1.6 mg/dL — ABNORMAL HIGH (ref 0.3–1.2)
Total Protein: 9.2 g/dL — ABNORMAL HIGH (ref 6.0–8.3)

## 2012-01-31 LAB — URINALYSIS, ROUTINE W REFLEX MICROSCOPIC
Glucose, UA: NEGATIVE mg/dL
Ketones, ur: NEGATIVE mg/dL
Protein, ur: 30 mg/dL — AB
Urobilinogen, UA: 0.2 mg/dL (ref 0.0–1.0)

## 2012-01-31 LAB — URINE MICROSCOPIC-ADD ON

## 2012-01-31 LAB — DIFFERENTIAL
Eosinophils Absolute: 0.1 10*3/uL (ref 0.0–0.7)
Lymphocytes Relative: 33 % (ref 12–46)
Lymphs Abs: 3.9 10*3/uL (ref 0.7–4.0)
Monocytes Relative: 9 % (ref 3–12)
Neutrophils Relative %: 57 % (ref 43–77)

## 2012-01-31 LAB — BASIC METABOLIC PANEL
BUN: 35 mg/dL — ABNORMAL HIGH (ref 6–23)
Chloride: 102 mEq/L (ref 96–112)
GFR calc Af Amer: 67 mL/min — ABNORMAL LOW (ref >90)
Potassium: 3.6 mEq/L (ref 3.5–5.1)

## 2012-01-31 LAB — CBC
Hemoglobin: 16.8 g/dL (ref 13.0–17.0)
MCH: 31.2 pg (ref 26.0–34.0)
MCV: 85.5 fL (ref 78.0–100.0)
RBC: 5.38 MIL/uL (ref 4.22–5.81)
WBC: 11.9 10*3/uL — ABNORMAL HIGH (ref 4.0–10.5)

## 2012-01-31 LAB — LACTIC ACID, PLASMA: Lactic Acid, Venous: 0.9 mmol/L (ref 0.5–2.2)

## 2012-01-31 MED ORDER — ONDANSETRON HCL 4 MG PO TABS: 4.0000 mg | ORAL_TABLET | Freq: Four times a day (QID) | ORAL | Status: AC

## 2012-01-31 MED ORDER — ONDANSETRON HCL 4 MG/2ML IJ SOLN
4.0000 mg | Freq: Once | INTRAMUSCULAR | Status: AC
  Administered 2012-01-31: 4 mg via INTRAVENOUS
  Filled 2012-01-31: qty 2

## 2012-01-31 MED ORDER — SODIUM CHLORIDE 0.9 % IV BOLUS (SEPSIS)
1000.0000 mL | Freq: Once | INTRAVENOUS | Status: AC
  Administered 2012-01-31: 1000 mL via INTRAVENOUS

## 2012-01-31 MED ORDER — HYDROMORPHONE HCL PF 1 MG/ML IJ SOLN
1.0000 mg | Freq: Once | INTRAMUSCULAR | Status: AC
  Administered 2012-01-31: 1 mg via INTRAVENOUS
  Filled 2012-01-31: qty 1

## 2012-01-31 NOTE — Discharge Instructions (Signed)
Nausea and Vomiting Kidney Failure In kidney failure, the kidneys lose their ability to filter enough waste products from the blood. They also lose the ability to regulate the body's balance of salt and water. Eventually, the kidneys slow their production of urine or stop producing it completely. Waste products and water gather in the body. This can lead to a life-threatening overload of fluids (such as heart failure). It can also lead to a dangerous buildup of waste products in the blood. These extreme changes in blood chemistry can affect the function of the heart and brain.  TYPES OF KIDNEY FAILURE Acute kidney failure. In this form of kidney failure, the kidneys stop working properly because of a sudden illness, a medicine, or a medical condition that causes one of the following:   A severe drop in blood pressure or an interruption in the normal blood flow to the kidneys. This can occur during:   Major surgery.   Severe burns with fluid loss.   Massive bleeding.   A heart attack that severely affects heart function.   Blood clots that travel to the kidney.   Direct damage to kidney cells or to the kidneys' filtering units. This can be caused by:   An inflammation of the kidneys.   Toxic chemicals.   Medicines or infections.   Blocked urine flow from the kidney. This can occur because of obstructions outside the kidney, such as:   Kidney stones.   Bladder tumors.   An enlarged prostate.  Blockage of urine flow within the kidney can also cause sudden kidney failure, as can occur with large muscle injuries.  Chronic kidney failure. In this form of kidney failure, the kidney gradually loses function. This happens over a period of years. It is a slow and gradual loss of the ability of the kidneys to send out wastes, concentrate urine, and conserve the salts in your blood. Some of the causes of chronic kidney failure are:  Diabetes (very common cause).   Polycystic kidney disease.    Glomerulonephritis.   Alport syndrome.   The flow of urine out of the kidney is blocked (obstructive uropathy).   High blood pressure (very common).   Long-term exposure to lead, mercury, and other chemicals and medicines.   Kidney stones with infection.   Reflux nephropathy.   Pain medicine overuse.  Some forms of chronic kidney failure run in families. Your caregiver will ask you about family medical problems.  End-stage kidney disease (ESKD). This is also called end-stage kidney failure. In ESKD, kidney function worsens until the person dies. This is usually the result of longstanding chronic kidney failure, but sometimes it follows acute kidney failure. SYMPTOMS  Symptoms vary depending on the type of kidney failure.   Acute kidney failure. Symptoms include:   Swelling (edema) resulting from salt and water overload.   High blood pressure.   Vomiting.   Tiredness (lethargy) caused by the toxic effects of waste products on brain function.   Feeling sick to your stomach (nauseous).   Decreased urine output.   Chronic kidney failure and ERSD. Because the kidney damage in chronic kidney failure occurs slowly over a long time, symptoms develop slowly. Symptoms can include:   Headache.   Weakness.   Itching.   Vomiting.   Pale skin.   Slowing of growth in children.   Fatigue.   Tiredness (lethargy).   Poor appetite.   Increased thirst.   High blood pressure.   Bone damage in adults.  DIAGNOSIS  If you have an illness or medical condition that increases the risk of acute kidney failure, your caregivers will watch you closely. You may have blood and urine tests that measure the function of your kidneys. If you have a medical condition that increases the risk of long-term kidney damage, your caregiver will check your blood pressure and look for symptoms of chronic kidney failure during rechecks. TREATMENT  Treatment depends on the type of kidney failure.     Acute kidney failure. Treatment begins with measures to correct the cause of kidney failure (shock, hemorrhage, burns, heart attack). After this has begun, more specific kidney treatment may include:   Fluids given through the vein (intravenously) to correct any abnormal fluid loss.   Medicines called diuretics that increase urine output.   Limited fluids by mouth.   A diet low in protein and high in carbohydrates.   Medicines to adjust high or low levels of blood chemicals, such as potassium and medicines to control high blood pressure.   Short-term dialysis may be necessary if the patient develops severe high blood pressure, severe fluid overload, heart failure, symptoms of altered brain function, or severe abnormalities in blood chemistry.   Chronic kidney failure. People with chronic kidney failure are watched closely. They receive frequent physical exams, blood pressure checks, and blood testing. Treatment includes:   A low-protein and low-salt diet.   Medicines to adjust blood chemical levels.   Medicines to treat high blood pressure.   Sometimes, a hormonal medicine called erythropoietin is given to correct a low level of red blood cells (anemia).   ESKD. Treatment includes:   Dialysis until a donor can be found for a kidney transplant. Dialysis mechanically removes waste products from the blood.   Both kidneys may need to be removed surgically before a transplant in patients with severe high blood pressure or chronic pyelonephritis.  PROGNOSIS   Acute kidney failure may go away on its own. Some people recover within a matter of days. Exactly how long the illness lasts varies greatly from person-to-person. The duration depends on the cause of the kidney problem. In rare cases, acute kidney failure progresses to ESKD. Among people who recover, about 50% have some permanent kidney damage. In most cases, this is not severe enough to prevent you from living a normal life.    Chronic kidney failure is a lifelong problem that can worsen over time to become ESKD. Not everyone develops ESKD. For those who do, the time it takes for ESKD to develop varies from person-to-person.   ESKD is a permanent condition that can be treated only with dialysis or a kidney transplant.  PREVENTION  Many forms of kidney failure cannot be prevented. People who have diabetes, high blood pressure, or coronary artery disease should try to control the illness with:  Appropriate diet.   Medicine.   Lifestyle changes.  If you have chronic kidney failure, you should tell all caregivers who treat you.  HOME CARE INSTRUCTIONS   Follow your diet and take your medicines as instructed.   Do not use any new medicines (prescription, over-the-counter, or nutritional supplements) unless approved by your caregiver. Many medicines can worsen your kidney damage or need to have the dose adjusted.   If dialysis is scheduled, keep all appointments. Call if you are unable to keep an appointment.  SEEK MEDICAL CARE IF:   You develop unexplained weakness, tiredness, or appetite loss.   You feel poorly with no clear explanation.  SEEK IMMEDIATE MEDICAL  CARE IF:   The amount of urine you produce either distinctly increases or decreases.   You develop swelling of the face and/or ankles.   You develop shortness of breath.  FOR MORE INFORMATION  National Institute of Diabetes and Digestive and Kidney Diseases: CheatPrevention.com.au National Kidney Foundation: www.kidney.org Document Released: 09/02/2005 Document Revised: 08/22/2011 Document Reviewed: 01/03/2010 [facility] Patient Information 2012 [place], [place]. Nausea is a sick feeling that often comes before throwing up (vomiting). Vomiting is a reflex where stomach contents come out of your mouth. Vomiting can cause severe loss of body fluids (dehydration). Children and elderly adults can become dehydrated quickly, especially if they also have  diarrhea. Nausea and vomiting are symptoms of a condition or disease. It is important to find the cause of your symptoms. CAUSES   Direct irritation of the stomach lining. This irritation can result from increased acid production (gastroesophageal reflux disease), infection, food poisoning, taking certain medicines (such as nonsteroidal anti-inflammatory drugs), alcohol use, or tobacco use.   Signals from the brain.These signals could be caused by a headache, heat exposure, an inner ear disturbance, increased pressure in the brain from injury, infection, a tumor, or a concussion, pain, emotional stimulus, or metabolic problems.   An obstruction in the gastrointestinal tract (bowel obstruction).   Illnesses such as diabetes, hepatitis, gallbladder problems, appendicitis, kidney problems, cancer, sepsis, atypical symptoms of a heart attack, or eating disorders.   Medical treatments such as chemotherapy and radiation.   Receiving medicine that makes you sleep (general anesthetic) during surgery.  DIAGNOSIS Your caregiver may ask for tests to be done if the problems do not improve after a few days. Tests may also be done if symptoms are severe or if the reason for the nausea and vomiting is not clear. Tests may include:  Urine tests.   Blood tests.   Stool tests.   Cultures (to look for evidence of infection).   X-rays or other imaging studies.  Test results can help your caregiver make decisions about treatment or the need for additional tests. TREATMENT You need to stay well hydrated. Drink frequently but in small amounts.You may wish to drink water, sports drinks, clear broth, or eat frozen ice pops or gelatin dessert to help stay hydrated.When you eat, eating slowly may help prevent nausea.There are also some antinausea medicines that may help prevent nausea. HOME CARE INSTRUCTIONS   Take all medicine as directed by your caregiver.   If you do not have an appetite, do not force  yourself to eat. However, you must continue to drink fluids.   If you have an appetite, eat a normal diet unless your caregiver tells you differently.   Eat a variety of complex carbohydrates (rice, wheat, potatoes, bread), lean meats, yogurt, fruits, and vegetables.   Avoid high-fat foods because they are more difficult to digest.   Drink enough water and fluids to keep your urine clear or pale yellow.   If you are dehydrated, ask your caregiver for specific rehydration instructions. Signs of dehydration may include:   Severe thirst.   Dry lips and mouth.   Dizziness.   Dark urine.   Decreasing urine frequency and amount.   Confusion.   Rapid breathing or pulse.  SEEK IMMEDIATE MEDICAL CARE IF:   You have blood or brown flecks (like coffee grounds) in your vomit.   You have black or bloody stools.   You have a severe headache or stiff neck.   You are confused.   You have severe  abdominal pain.   You have chest pain or trouble breathing.   You do not urinate at least once every 8 hours.   You develop cold or clammy skin.   You continue to vomit for longer than 24 to 48 hours.   You have a fever.  MAKE SURE YOU:   Understand these instructions.   Will watch your condition.   Will get help right away if you are not doing well or get worse.  Document Released: 09/02/2005 Document Revised: 08/22/2011 Document Reviewed: 01/30/2011 [facility] Patient Information 2012 [employer], [place].

## 2012-01-31 NOTE — ED Notes (Signed)
Pt. Resting on stretcher, NAD noted,

## 2012-01-31 NOTE — ED Notes (Signed)
Cramping to abdomen. Vomiting. Unable to hold down anything x 3 days per spouse. Cramping to body.

## 2012-01-31 NOTE — ED Provider Notes (Signed)
History   This chart was scribed for Glynn Octave, MD by Charolett Bumpers . The patient was seen in room [facility]12[facility]12.    CSN: [id number]  Arrival date & time 01/31/12  1411   First MD Initiated Contact with Patient 01/31/12 1503      Chief Complaint  Patient presents with  . Abdominal Pain  . Emesis    (Consider location/radiation/quality/duration/timing/severity/associated sxs/prior treatment) HPI Mario Bautista is a 31 y.o. male who presents to the Emergency Department complaining of intermittent, moderate generalized abdominal pain with associated n/v for the past 3 days. Patient states that the abdominal pain "comes and goes". Patient describes the abdominal pain as cramping. Patient states that nothing makes the abdominal pain better or worse. Patient reports that his is unable to keep anything down PO. Patient states that he had his appendix removed 2.5 weeks ago. Patient states that this current pain is not like his appendix pain. Patient denies any sick contacts. Patient denies fever. Patient denies having any abdominal problems prior to appendectomy. Patient reports no pertinent medical hx.   Past Medical History  Diagnosis Date  . Pneumothorax   . Migraines     Past Surgical History  Procedure Date  . Laparoscopic appendectomy 01/14/2012    Procedure: APPENDECTOMY LAPAROSCOPIC;  Surgeon: Fabio Bering, MD;  Location: [facility];  Service: General;  Laterality: Right;    Family History  Problem Relation Age of Onset  . Pseudochol deficiency Neg Hx   . Malignant hyperthermia Neg Hx   . Hypotension Neg Hx   . Anesthesia problems Neg Hx     History  Substance Use Topics  . Smoking status: Current Everyday Smoker -- 0.5 packs/day for 15 years    Types: Cigarettes  . Smokeless tobacco: Not on file  . Alcohol Use: No      Review of Systems  Constitutional: Negative for fever.  Gastrointestinal: Positive for nausea, vomiting and abdominal pain. Negative  for diarrhea and constipation.  Genitourinary: Negative for dysuria.  Musculoskeletal: Positive for back pain.  Skin: Negative for rash.  All other systems reviewed and are negative.    Allergies  Penicillins and Imitrex  Home Medications   Current Outpatient Rx  Name Route Sig Dispense Refill  . PROMETHAZINE HCL 25 MG PO TABS Oral Take 1 tablet (25 mg total) by mouth every 6 (six) hours as needed for nausea (headache). 12 tablet 0    BP 113/77  Pulse 73  Temp(Src) 97.5 F (36.4 C) (Oral)  Resp 16  Ht 6' (1.829 m)  Wt 130 lb (58.968 kg)  BMI 17.63 kg/m2  SpO2 97%  Physical Exam  Nursing note and vitals reviewed. Constitutional: He is oriented to person, place, and time. He appears well-developed and well-nourished. No distress.  HENT:  Head: Normocephalic and atraumatic.  Eyes: EOM are normal. Pupils are equal, round, and reactive to light.  Neck: Normal range of motion. Neck supple. No tracheal deviation present.  Cardiovascular: Normal rate, regular rhythm and normal heart sounds.   No murmur heard. Pulmonary/Chest: Effort normal and breath sounds normal. No respiratory distress.  Abdominal: Soft. He exhibits no distension. There is tenderness. There is guarding. There is no rebound.       Mild diffuse tenderness with voluntary guarding. No CVA tenderness.   Musculoskeletal: Normal range of motion. He exhibits no edema.       Bilaterally perispinal tenderness.   Neurological: He is alert and oriented to person, place, and time. No  sensory deficit.  Skin: Skin is warm and dry.  Psychiatric: He has a normal mood and affect. His behavior is normal.    ED Course  Procedures (including critical care time)  DIAGNOSTIC STUDIES: Oxygen Saturation is 100% on room air, normal by my interpretation.    COORDINATION OF CARE:  1517: Discussed planned course of treatment with the patient, who is agreeable at this time.  1530: Medication Orders: Sodium Chloride 0.9% bolus  1,000 mL-once; Ondansetron (Zofran) injection 4 mg-once; Hydromorphone (Dilaudid) injection 1 mg-once.  1715: Medication Orders: Hydromorphone (Dilaudid) injection 1 mg-once 1732: Discussed imaging and lab results with the patient. Discussed another round of IV fluids and rechecking labs. Patient is agreeable at this time.  1745: Medication Orders: Sodium Chloride 0.9% bolus 1,000 mL-once 1830: Medication Orders: Sodium Chloride 0.9% bolus 1,000 mL-once 1900: Recheck: Patient feeling improved.    Labs Reviewed  CBC - Abnormal; Notable for the following:    WBC 11.9 (*)    MCHC 36.5 (*)    All other components within normal limits  DIFFERENTIAL - Abnormal; Notable for the following:    Monocytes Absolute 1.1 (*)    All other components within normal limits  COMPREHENSIVE METABOLIC PANEL - Abnormal; Notable for the following:    Sodium 131 (*)    Chloride 91 (*)    Glucose, Bld 101 (*)    BUN 45 (*)    Creatinine, Ser 2.36 (*)    Calcium 11.3 (*)    Total Protein 9.2 (*)    Albumin 5.3 (*)    AST 51 (*)    Total Bilirubin 1.6 (*)    GFR calc non Af Amer 35 (*)    GFR calc Af Amer 41 (*)    All other components within normal limits  URINALYSIS, ROUTINE W REFLEX MICROSCOPIC - Abnormal; Notable for the following:    Specific Gravity, Urine >1.030 (*)    Hgb urine dipstick LARGE (*)    Bilirubin Urine SMALL (*)    Protein, ur 30 (*)    All other components within normal limits  URINE MICROSCOPIC-ADD ON - Abnormal; Notable for the following:    Squamous Epithelial / LPF MANY (*)    Bacteria, UA MANY (*)    Casts HYALINE CASTS (*) GRANULAR CAST   All other components within normal limits  LACTIC ACID, PLASMA  LIPASE, BLOOD  URINE CULTURE  BASIC METABOLIC PANEL   Ct Abdomen Pelvis Wo Contrast  01/31/2012  *RADIOLOGY REPORT*  Clinical Data: Recent appendectomy, now with nausea/vomiting and abdominal pain  CT ABDOMEN AND PELVIS WITHOUT CONTRAST  Technique:  Multidetector CT  imaging of the abdomen and pelvis was performed following the standard protocol without intravenous contrast.  Comparison: 01/14/2012  Findings: Lung bases are essentially clear.  Unenhanced liver is notable for probable focal fat along the falciform ligament (series 2/image 20).  Unenhanced spleen, pancreas, and adrenal glands are within normal limits.  Gallbladder is unremarkable.  No intrahepatic or extrahepatic ductal dilatation.  Multiple small bilateral nonobstructing renal calculi measuring up to 3 mm.  No hydronephrosis.  No evidence of bowel obstruction.  Status post appendectomy.  No evidence of abscess or postsurgical complication.  No evidence of abdominal aortic aneurysm.  No abdominopelvic ascites.  No suspicious abdominopelvic lymphadenopathy.  Prostate is unremarkable.  No ureteral or bladder calculi.  Mild degenerative changes of the visualized thoracolumbar spine.  IMPRESSION: Status post appendectomy.  No evidence of complication.  No evidence of bowel obstruction.  Multiple  small bilateral nonobstructing renal calculi measuring up to 3 mm.  No ureteral or bladder calculi.  No hydronephrosis.  Original Report Authenticated By: Charline Bills, M.D.     No diagnosis found.    MDM  Diffuse abdominal pain with nausea and vomiting for the past 3 days. Pain is crampy and intermittent. No diarrhea. History remarkable for appendectomy on April 30.  Acute renal failure with contaminated urine with blood. Discussed with CT not administered IV contrast.  No acute pathology on CT. No calculi within ureter despite hematuria. Patient informed of elevated creatinine BUN. He does not want to be admitted. He is agreeable to further IV hydration in the ED and recheck of his chemistries.  After hydration patient's creatinine is improved to 1.5. He feels better and has no abdominal pain or vomiting. He is tolerating by mouth. I did mention to Dr. Lovell Sheehan as the patient was in the department as Dr.  Lovell Sheehan was in the department seeing another patient. I do not feel his symptoms today are related to his recent surgery.  Recheck by PCP in 2 days.  I personally performed the services described in this documentation, which was scribed in my presence.  The recorded information has been reviewed and considered.      Glynn Octave, MD 01/31/12 2158

## 2012-01-31 NOTE — ED Notes (Signed)
Given coke with ice for fluid challenge.  Pt sipping and tolerating well.

## 2012-02-02 LAB — URINE CULTURE
Colony Count: NO GROWTH
Culture  Setup Time: 201305180154
Culture: NO GROWTH

## 2012-02-05 NOTE — Discharge Summary (Signed)
Physician Discharge Summary  Patient ID: Mario Bautista MRN: [id number] DOB/AGE: [birth year] 31 y.o.  Admit date: 01/13/2012 Discharge date: 01/14/2012  Admission Diagnoses: Acute appendicitis  Discharge Diagnoses: The same Active Problems:  * No active hospital problems. *    Discharged Condition: stable  Hospital Course: Patient presented to [facility] emergency department late the night of 4/29. Workup was initially conducted by the emergency room physician which demonstrated suspicion for acute appendicitis which was confirmed with CT evaluation. Patient was admitted for planned surgical intervention. He was taken to the operating room early the morning of 01/14/2012 for a laparoscopic appendectomy. He tolerated procedure extremely well. Brief period postanesthetic care unit and was transferred back to a regular medical surgical floor. His postoperative course was uneventful. He was advanced on his diet. Pain was controlled with oral analgesia. 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  Consults: None  Significant Diagnostic Studies: radiology: CT scan: Abdomen and pelvis  Treatments: surgery: Laparoscopic appendectomy  Discharge Exam: Blood pressure 116/68, pulse 59, temperature 97.7 F (36.5 C), temperature source Oral, resp. rate 20, height 6' (1.829 m), weight 63.504 kg (140 lb), SpO2 92.00%. General appearance: alert and no distress Resp: clear to auscultation bilaterally Cardio: regular rate and rhythm GI: Soft, flat, expected postoperative tenderness. Incisions are clean dry and intact. No peritoneal signs.  Disposition: 01-Home or Self Care  Discharge Orders    Future Orders Please Complete By Expires   Diet - low sodium heart healthy      Increase activity slowly      Discharge instructions      Comments:   Increase activity as tolerated. May place ice pack for comfort.  Alternate an anti-inflammatory such as ibuprofen (Motrin, Advil) 400-600mg   every 6 hours with the prescribed pain medication.   Do not take any additional acetaminophen as there is Tylenol in the pain medication.    Driving Restrictions      Comments:   No driving while on pain medications.    Lifting restrictions      Comments:   No lifting over 20lbs for 4-5 weeks post-op.    Discharge wound care:      Comments:   Clean surgical sites with soap and water.  May shower the morning after surgery unless instructed by Dr. Leticia Penna otherwise.  No soaking for 2-3 weeks.    If adhesive strips are in place, they may be removed in 1-2 weeks while in the shower.    Call MD for:  temperature >100.4      Call MD for:  persistant nausea and vomiting      Call MD for:  severe uncontrolled pain      Call MD for:  redness, tenderness, or signs of infection (pain, swelling, redness, odor or green/yellow discharge around incision site)        Medication List  As of 02/05/2012 10:14 AM   TAKE these medications         promethazine 25 MG tablet   Commonly known as: PHENERGAN   Take 1 tablet (25 mg total) by mouth every 6 (six) hours as needed for nausea (headache).           Follow-up Information    Follow up with Humza Tallerico C, MD in 3 weeks.   Contact information:   [address] [place] [address] [phone]          Signed: Fabio Bering 02/05/2012, 10:14 AM

## 2012-07-16 ENCOUNTER — Encounter (HOSPITAL_COMMUNITY): Payer: Self-pay

## 2012-07-16 ENCOUNTER — Emergency Department (HOSPITAL_COMMUNITY)
Admission: EM | Admit: 2012-07-16 | Discharge: 2012-07-17 | Disposition: A | Discharge status: Home / Self Care | Payer: Self-pay | Attending: Emergency Medicine | Admitting: Emergency Medicine

## 2012-07-16 DIAGNOSIS — L03317 Cellulitis of buttock: Secondary | ICD-10-CM | POA: Insufficient documentation

## 2012-07-16 DIAGNOSIS — Z8709 Personal history of other diseases of the respiratory system: Secondary | ICD-10-CM | POA: Insufficient documentation

## 2012-07-16 DIAGNOSIS — L0231 Cutaneous abscess of buttock: Secondary | ICD-10-CM | POA: Insufficient documentation

## 2012-07-16 DIAGNOSIS — Z79899 Other long term (current) drug therapy: Secondary | ICD-10-CM | POA: Insufficient documentation

## 2012-07-16 DIAGNOSIS — F17200 Nicotine dependence, unspecified, uncomplicated: Secondary | ICD-10-CM | POA: Insufficient documentation

## 2012-07-16 DIAGNOSIS — K611 Rectal abscess: Secondary | ICD-10-CM

## 2012-07-16 MED ORDER — LIDOCAINE HCL (PF) 2 % IJ SOLN
INTRAMUSCULAR | Status: AC
  Filled 2012-07-16: qty 10

## 2012-07-16 NOTE — ED Notes (Signed)
Patient c/o abscess to left buttock x 2-3 days.

## 2012-07-16 NOTE — ED Notes (Signed)
Paged Dr. Lovell Sheehan for Mario Bautista

## 2012-07-17 MED ORDER — METRONIDAZOLE 500 MG PO TABS: 500.0000 mg | ORAL_TABLET | Freq: Two times a day (BID) | ORAL | Status: DC

## 2012-07-17 MED ORDER — DOCUSATE SODIUM 100 MG PO CAPS: 100.0000 mg | ORAL_CAPSULE | Freq: Two times a day (BID) | ORAL | Status: DC

## 2012-07-17 MED ORDER — LIDOCAINE-EPINEPHRINE (PF) 2 %-1:200000 IJ SOLN
INTRAMUSCULAR | Status: AC
  Administered 2012-07-17: 20 mL
  Filled 2012-07-17: qty 20

## 2012-07-17 MED ORDER — OXYCODONE-ACETAMINOPHEN 5-325 MG PO TABS: 1.0000 | ORAL_TABLET | ORAL | Status: AC | PRN

## 2012-07-17 MED ORDER — ONDANSETRON 8 MG PO TBDP
8.0000 mg | ORAL_TABLET | Freq: Once | ORAL | Status: AC
  Administered 2012-07-17: 8 mg via ORAL
  Filled 2012-07-17: qty 1

## 2012-07-17 MED ORDER — HYDROMORPHONE HCL PF 1 MG/ML IJ SOLN
1.0000 mg | Freq: Once | INTRAMUSCULAR | Status: AC
  Administered 2012-07-17: 1 mg via INTRAMUSCULAR
  Filled 2012-07-17: qty 1

## 2012-07-18 NOTE — ED Provider Notes (Signed)
Medical screening examination/treatment/procedure(s) were performed by non-physician practitioner and as supervising physician I was immediately available for consultation/collaboration.  Sunnie Nielsen, MD 07/18/12 [phone]

## 2012-07-18 NOTE — ED Provider Notes (Signed)
History     CSN: [id number]  Arrival date & time 07/16/12  2322   First MD Initiated Contact with Patient 07/16/12 2326      Chief Complaint  Patient presents with  . Abscess    (Consider location/radiation/quality/duration/timing/severity/associated sxs/prior treatment) Patient is a 31 y.o. male presenting with abscess. The history is provided by the patient and the spouse.  Abscess  This is a new problem. The current episode started less than one week ago. The onset was gradual. The problem occurs continuously. The problem has been gradually worsening. The abscess is present on the left buttock. The problem is moderate. The abscess is characterized by painfulness and swelling. Pertinent negatives include no fever.    Past Medical History  Diagnosis Date  . Pneumothorax   . Migraines     Past Surgical History  Procedure Date  . Laparoscopic appendectomy 01/14/2012    Procedure: APPENDECTOMY LAPAROSCOPIC;  Surgeon: Fabio Bering, MD;  Location: [facility];  Service: General;  Laterality: Right;    Family History  Problem Relation Age of Onset  . Pseudochol deficiency Neg Hx   . Malignant hyperthermia Neg Hx   . Hypotension Neg Hx   . Anesthesia problems Neg Hx     History  Substance Use Topics  . Smoking status: Current Every Day Smoker -- 0.5 packs/day for 15 years    Types: Cigarettes  . Smokeless tobacco: Not on file  . Alcohol Use: Yes      Review of Systems  Constitutional: Negative for fever and chills.  HENT: Negative for facial swelling.   Respiratory: Negative for shortness of breath and wheezing.   Neurological: Negative for numbness.    Allergies  Penicillins and Imitrex  Home Medications   Current Outpatient Rx  Name Route Sig Dispense Refill  . DOCUSATE SODIUM 100 MG PO CAPS Oral Take 1 capsule (100 mg total) by mouth every 12 (twelve) hours. 60 capsule 0  . METRONIDAZOLE 500 MG PO TABS Oral Take 1 tablet (500 mg total) by mouth 2 (two)  times daily. 14 tablet 0  . OXYCODONE-ACETAMINOPHEN 5-325 MG PO TABS Oral Take 1 tablet by mouth every 4 (four) hours as needed for pain. 20 tablet 0  . PROMETHAZINE HCL 25 MG PO TABS Oral Take 1 tablet (25 mg total) by mouth every 6 (six) hours as needed for nausea (headache). 12 tablet 0    BP 127/81  Pulse 88  Temp 98.2 F (36.8 C) (Oral)  Resp 18  Ht 6' (1.829 m)  Wt 124 lb (56.246 kg)  BMI 16.82 kg/m2  SpO2 97%  Physical Exam  Constitutional: He is oriented to person, place, and time. He appears well-developed and well-nourished.  HENT:  Head: Normocephalic.  Cardiovascular: Normal rate.   Pulmonary/Chest: Effort normal.  Musculoskeletal: Normal range of motion.  Neurological: He is alert and oriented to person, place, and time. No sensory deficit.  Skin:       3 cm fluctuant abscess left medial buttock near anus.  No drainage.  Pain with attempt at rectal exam,  Unable to appreciate rectal tracking.    No surrounding erythema.    ED Course  Procedures (including critical care time)  Labs Reviewed - No data to display No results found.   1. Perirectal abscess    INCISION AND DRAINAGE Performed by: Burgess Amor Consent: Verbal consent obtained. Risks and benefits: risks, benefits and alternatives were discussed Type: abscess  Body area: left buttock  Anesthesia: local infiltration  Local anesthetic: lidocaine 2% with epinephrine  Anesthetic total: 5 ml  Complexity: complex Blunt dissection to break up loculations  Drainage: purulent  Drainage amount: moderate  Packing material: no packing  Patient tolerance: Patient tolerated the procedure well with no immediate complications.  Dressed with abd pad.    MDM  Discussed with general surgeon Dr Lovell Sheehan.  Will follow with office visit , patient to call for appt time in 4 days.  In interim,  Flagyl 500 bid, patient also prescribed oxycodone,  Colace to help prevent constipation.  Advised warm sitz baths  tid.  Peroxide applied with q tip to abscess edges twice daily to keep site opened and draining.          Burgess Amor, [place] 07/18/12 [phone]

## 2012-09-19 ENCOUNTER — Emergency Department (HOSPITAL_COMMUNITY): Payer: Self-pay

## 2012-09-19 ENCOUNTER — Emergency Department (HOSPITAL_COMMUNITY)
Admission: EM | Admit: 2012-09-19 | Discharge: 2012-09-20 | Disposition: A | Discharge status: Home / Self Care | Payer: Self-pay | Attending: Emergency Medicine | Admitting: Emergency Medicine

## 2012-09-19 ENCOUNTER — Encounter (HOSPITAL_COMMUNITY): Payer: Self-pay | Admitting: *Deleted

## 2012-09-19 DIAGNOSIS — R197 Diarrhea, unspecified: Secondary | ICD-10-CM | POA: Insufficient documentation

## 2012-09-19 DIAGNOSIS — Z8709 Personal history of other diseases of the respiratory system: Secondary | ICD-10-CM | POA: Insufficient documentation

## 2012-09-19 DIAGNOSIS — G43909 Migraine, unspecified, not intractable, without status migrainosus: Secondary | ICD-10-CM | POA: Insufficient documentation

## 2012-09-19 DIAGNOSIS — IMO0001 Reserved for inherently not codable concepts without codable children: Secondary | ICD-10-CM | POA: Insufficient documentation

## 2012-09-19 DIAGNOSIS — R059 Cough, unspecified: Secondary | ICD-10-CM | POA: Insufficient documentation

## 2012-09-19 DIAGNOSIS — R112 Nausea with vomiting, unspecified: Secondary | ICD-10-CM | POA: Insufficient documentation

## 2012-09-19 DIAGNOSIS — R05 Cough: Secondary | ICD-10-CM | POA: Insufficient documentation

## 2012-09-19 DIAGNOSIS — R6889 Other general symptoms and signs: Secondary | ICD-10-CM

## 2012-09-19 DIAGNOSIS — J3489 Other specified disorders of nose and nasal sinuses: Secondary | ICD-10-CM | POA: Insufficient documentation

## 2012-09-19 DIAGNOSIS — F17200 Nicotine dependence, unspecified, uncomplicated: Secondary | ICD-10-CM | POA: Insufficient documentation

## 2012-09-19 LAB — BASIC METABOLIC PANEL
CO2: 27 mEq/L (ref 19–32)
Chloride: 96 mEq/L (ref 96–112)
Sodium: 133 mEq/L — ABNORMAL LOW (ref 135–145)

## 2012-09-19 MED ORDER — ONDANSETRON HCL 4 MG/2ML IJ SOLN
4.0000 mg | Freq: Once | INTRAMUSCULAR | Status: AC
  Administered 2012-09-19: 4 mg via INTRAVENOUS
  Filled 2012-09-19: qty 2

## 2012-09-19 MED ORDER — SODIUM CHLORIDE 0.9 % IV BOLUS (SEPSIS)
1000.0000 mL | Freq: Once | INTRAVENOUS | Status: AC
  Administered 2012-09-19: 1000 mL via INTRAVENOUS

## 2012-09-19 NOTE — ED Provider Notes (Signed)
History     CSN: [id number]  Arrival date & time 09/19/12  2104   First MD Initiated Contact with Patient 09/19/12 2225      Chief Complaint  Patient presents with  . Fever  . Migraine  . Cough  . Nasal Congestion    (Consider location/radiation/quality/duration/timing/severity/associated sxs/prior treatment) HPI Comments: Mario Bautista is a 32 y.o. Male who presents with a 1 day history of nonproductive cough,  Fever to 101,  Headache, generalized myalgias, nausea with emesis and non bloody diarrhea since last night (which has slowed down, as last episode of diarrhea has been over 4 hours ago despite no antidiarrheal medicines).  He feels generalized fatigue and has had no PO intake today which he has not vomited.  He denies abdominal pain,  But reports cramping prior to bm's.  He has taken no medications prior to arrival.       The history is provided by the patient and the spouse.    Past Medical History  Diagnosis Date  . Pneumothorax   . Migraines     Past Surgical History  Procedure Date  . Laparoscopic appendectomy 01/14/2012    Procedure: APPENDECTOMY LAPAROSCOPIC;  Surgeon: Fabio Bering, MD;  Location: [facility];  Service: General;  Laterality: Right;    Family History  Problem Relation Age of Onset  . Pseudochol deficiency Neg Hx   . Malignant hyperthermia Neg Hx   . Hypotension Neg Hx   . Anesthesia problems Neg Hx     History  Substance Use Topics  . Smoking status: Current Every Day Smoker -- 0.5 packs/day for 15 years    Types: Cigarettes  . Smokeless tobacco: Not on file  . Alcohol Use: Yes      Review of Systems  Constitutional: Positive for fever and fatigue.  HENT: Negative for congestion, sore throat, neck pain and neck stiffness.   Eyes: Negative.   Respiratory: Positive for cough. Negative for chest tightness, shortness of breath and wheezing.   Cardiovascular: Negative for chest pain.  Gastrointestinal: Positive for nausea, vomiting  and diarrhea. Negative for abdominal pain.  Genitourinary: Negative.   Musculoskeletal: Positive for myalgias. Negative for joint swelling and arthralgias.  Skin: Negative.  Negative for rash and wound.  Neurological: Negative for dizziness, weakness, light-headedness, numbness and headaches.  Hematological: Negative.   Psychiatric/Behavioral: Negative.     Allergies  Penicillins and Imitrex  Home Medications   Current Outpatient Rx  Name  Route  Sig  Dispense  Refill  . PROMETHAZINE HCL 25 MG PO TABS   Oral   Take 1 tablet (25 mg total) by mouth every 6 (six) hours as needed for nausea (headache).   12 tablet   0     BP 130/87  Pulse 107  Temp 99.8 F (37.7 C)  Resp 20  Ht 6' (1.829 m)  Wt 152 lb (68.947 kg)  BMI 20.61 kg/m2  SpO2 98%  Physical Exam  Nursing note and vitals reviewed. Constitutional: He appears well-developed and well-nourished.       Appears fatigued.  HENT:  Head: Normocephalic and atraumatic.  Eyes: Conjunctivae normal are normal.  Neck: Normal range of motion.  Cardiovascular: Normal rate, regular rhythm, normal heart sounds and intact distal pulses.   Pulmonary/Chest: Effort normal and breath sounds normal. No respiratory distress. He has no wheezes.       Several scattered course rhonchi,  Which clears with cough.  Abdominal: Soft. Bowel sounds are normal. He exhibits no  distension. There is no tenderness. There is no rebound and no guarding.  Musculoskeletal: Normal range of motion.  Neurological: He is alert.  Skin: Skin is warm and dry.  Psychiatric: He has a normal mood and affect.    ED Course  Procedures (including critical care time)  Labs Reviewed - No data to display No results found.   No diagnosis found.  Results for orders placed during the hospital encounter of 09/19/12  BASIC METABOLIC PANEL      Component Value Range   Sodium 133 (*) 135 - 145 mEq/L   Potassium 3.8  3.5 - 5.1 mEq/L   Chloride 96  96 - 112 mEq/L    CO2 27  19 - 32 mEq/L   Glucose, Bld 108 (*) 70 - 99 mg/dL   BUN 10  6 - 23 mg/dL   Creatinine, Ser 1.61  0.50 - 1.35 mg/dL   Calcium 9.5  8.4 - 09.6 mg/dL   GFR calc non Af Amer >90  >90 mL/min   GFR calc Af Amer >90  >90 mL/min     MDM  Pt was given NS IV fluids,  zofran and toradol for headache and body aches.  He tolerated a po trial of fluids.  cxr reviewed,  No pulmonary disease.  Sx suspected to be influenza.  Discussed tamiflu which pt was desirous of - first dose given so as to start med within 48 hr of sx onset.  Prescription given,  Also for zofran.  Encouraged rest, increased fluids,  Motrin or tylenol for fever, headache.  Recheck for worsened sx.        Burgess Amor, [place] 09/20/12 1312

## 2012-09-19 NOTE — ED Notes (Addendum)
Pt c/o cough, fever, migraine x 1 days.

## 2012-09-20 MED ORDER — OSELTAMIVIR PHOSPHATE 75 MG PO CAPS: 75.0000 mg | ORAL_CAPSULE | Freq: Two times a day (BID) | ORAL | Status: DC

## 2012-09-20 MED ORDER — PROMETHAZINE-CODEINE 6.25-10 MG/5ML PO SYRP: 5.0000 mL | ORAL_SOLUTION | ORAL | Status: DC | PRN

## 2012-09-20 MED ORDER — KETOROLAC TROMETHAMINE 30 MG/ML IJ SOLN
30.0000 mg | Freq: Once | INTRAMUSCULAR | Status: AC
  Administered 2012-09-20: 30 mg via INTRAVENOUS
  Filled 2012-09-20: qty 1

## 2012-09-20 MED ORDER — OSELTAMIVIR PHOSPHATE 75 MG PO CAPS
75.0000 mg | ORAL_CAPSULE | Freq: Once | ORAL | Status: AC
  Administered 2012-09-20: 75 mg via ORAL
  Filled 2012-09-20: qty 1

## 2012-09-20 NOTE — ED Notes (Signed)
Discharge instructions reviewed with pt, questions answered. Pt verbalized understanding.  

## 2012-09-20 NOTE — ED Notes (Signed)
Pt states pain is 9/10 headache

## 2012-09-23 NOTE — ED Provider Notes (Signed)
Medical screening examination/treatment/procedure(s) were performed by non-physician practitioner and as supervising physician I was immediately available for consultation/collaboration.   Ashley Montminy W. Jane Birkel, MD 09/23/12 2348 

## 2012-10-22 ENCOUNTER — Emergency Department (HOSPITAL_COMMUNITY)
Admission: EM | Admit: 2012-10-22 | Discharge: 2012-10-22 | Disposition: A | Discharge status: Home / Self Care | Payer: Self-pay | Attending: Emergency Medicine | Admitting: Emergency Medicine

## 2012-10-22 ENCOUNTER — Encounter (HOSPITAL_COMMUNITY): Payer: Self-pay | Admitting: *Deleted

## 2012-10-22 DIAGNOSIS — K611 Rectal abscess: Secondary | ICD-10-CM

## 2012-10-22 DIAGNOSIS — Z8679 Personal history of other diseases of the circulatory system: Secondary | ICD-10-CM | POA: Insufficient documentation

## 2012-10-22 DIAGNOSIS — K612 Anorectal abscess: Secondary | ICD-10-CM | POA: Insufficient documentation

## 2012-10-22 DIAGNOSIS — Z8709 Personal history of other diseases of the respiratory system: Secondary | ICD-10-CM | POA: Insufficient documentation

## 2012-10-22 DIAGNOSIS — F17200 Nicotine dependence, unspecified, uncomplicated: Secondary | ICD-10-CM | POA: Insufficient documentation

## 2012-10-22 MED ORDER — METRONIDAZOLE 500 MG PO TABS
500.0000 mg | ORAL_TABLET | Freq: Once | ORAL | Status: AC
  Administered 2012-10-22: 500 mg via ORAL
  Filled 2012-10-22: qty 1

## 2012-10-22 MED ORDER — OXYCODONE-ACETAMINOPHEN 5-325 MG PO TABS: 1.0000 | ORAL_TABLET | ORAL | Status: AC | PRN

## 2012-10-22 MED ORDER — OXYCODONE-ACETAMINOPHEN 5-325 MG PO TABS
1.0000 | ORAL_TABLET | Freq: Once | ORAL | Status: AC
  Administered 2012-10-22: 1 via ORAL
  Filled 2012-10-22: qty 1

## 2012-10-22 MED ORDER — METRONIDAZOLE 500 MG PO TABS: 500.0000 mg | ORAL_TABLET | Freq: Two times a day (BID) | ORAL | Status: DC

## 2012-10-22 NOTE — ED Notes (Signed)
Abscess to buttocks  

## 2012-10-22 NOTE — ED Provider Notes (Signed)
History     CSN: [id number]  Arrival date & time 10/22/12  1953   First MD Initiated Contact with Patient 10/22/12 2002      Chief Complaint  Patient presents with  . Abscess    (Consider location/radiation/quality/duration/timing/severity/associated sxs/prior treatment) HPI Comments: Mario Bautista is a 32 y.o. Male presenting with a return of a perirectal abscess he last had I &D 'd here 4 months ago,  Which was completely resolved until 3 days ago when he developed increased pain at the site.  He was referred to Dr Lovell Sheehan at the previous visit for this , but did not followup as his symptoms resolved. He denies drainage from the abscess site and he has had no constitutional symptoms such as fevers, chills, nausea or vomiting. He has taken no medications prior to arrival.       Patient is a 32 y.o. male presenting with abscess. The history is provided by the patient.  Abscess  Pertinent negatives include no fever, no congestion and no sore throat.    Past Medical History  Diagnosis Date  . Pneumothorax   . Migraines     Past Surgical History  Procedure Date  . Laparoscopic appendectomy 01/14/2012    Procedure: APPENDECTOMY LAPAROSCOPIC;  Surgeon: Fabio Bering, MD;  Location: [facility];  Service: General;  Laterality: Right;  . Appendectomy     Family History  Problem Relation Age of Onset  . Pseudochol deficiency Neg Hx   . Malignant hyperthermia Neg Hx   . Hypotension Neg Hx   . Anesthesia problems Neg Hx     History  Substance Use Topics  . Smoking status: Current Every Day Smoker -- 0.5 packs/day for 15 years    Types: Cigarettes  . Smokeless tobacco: Not on file  . Alcohol Use: Yes      Review of Systems  Constitutional: Negative for fever and chills.  HENT: Negative for congestion, sore throat and neck pain.   Eyes: Negative.   Respiratory: Negative for chest tightness and shortness of breath.   Cardiovascular: Negative for chest pain.   Gastrointestinal: Negative for nausea and abdominal pain.  Genitourinary: Negative.   Musculoskeletal: Negative for joint swelling and arthralgias.  Skin: Positive for wound. Negative for rash.  Neurological: Negative for dizziness, weakness, light-headedness, numbness and headaches.  Hematological: Negative.   Psychiatric/Behavioral: Negative.     Allergies  Penicillins and Imitrex  Home Medications   Current Outpatient Rx  Name  Route  Sig  Dispense  Refill  . METRONIDAZOLE 500 MG PO TABS   Oral   Take 1 tablet (500 mg total) by mouth 2 (two) times daily.   20 tablet   0   . OSELTAMIVIR PHOSPHATE 75 MG PO CAPS   Oral   Take 1 capsule (75 mg total) by mouth every 12 (twelve) hours.   9 capsule   0   . OXYCODONE-ACETAMINOPHEN 5-325 MG PO TABS   Oral   Take 1 tablet by mouth every 4 (four) hours as needed for pain.   20 tablet   0   . PROMETHAZINE HCL 25 MG PO TABS   Oral   Take 1 tablet (25 mg total) by mouth every 6 (six) hours as needed for nausea (headache).   12 tablet   0   . PROMETHAZINE-CODEINE 6.25-10 MG/5ML PO SYRP   Oral   Take 5 mLs by mouth every 4 (four) hours as needed for cough.   120 mL   0  BP 113/79  Pulse 96  Temp 97.9 F (36.6 C) (Oral)  Resp 20  Ht 6' (1.829 m)  Wt 144 lb (65.318 kg)  BMI 19.53 kg/m2  SpO2 99%  Physical Exam  Constitutional: He appears well-developed and well-nourished. No distress.  HENT:  Head: Normocephalic.  Neck: Neck supple.  Cardiovascular: Normal rate.   Pulmonary/Chest: Effort normal.  Abdominal: Soft. He exhibits no distension. There is no tenderness.  Musculoskeletal: Normal range of motion. He exhibits no edema.  Skin: Skin is warm and dry.       1 cm tender lesion which is macular,  Not raised, appears shallow with no appreciable fluctuance.  No surrounding erythema.    ED Course  Procedures (including critical care time)  Labs Reviewed - No data to display No results found.   1.  Perirectal abscess       MDM  Given location of abscess,  Was placed on flagyl,  As was recommended by Dr Lovell Sheehan during his last episode of perirectal abscess in October.  Also prescribed oxycodone.  Encouraged warm epsom salt soaks bid. Discussed i and d, pt prefers to try warm soaks.  Pt aware he may need I and D if site worsens.  Referral to Dr Lovell Sheehan for further management, or return here if sx worsen.        Burgess Amor, [place] 10/22/12 2217

## 2012-10-23 NOTE — ED Provider Notes (Signed)
Medical screening examination/treatment/procedure(s) were performed by non-physician practitioner and as supervising physician I was immediately available for consultation/collaboration.   Georganne Siple W. Azion Centrella, MD 10/23/12 1307 

## 2013-12-04 ENCOUNTER — Emergency Department (HOSPITAL_COMMUNITY): Payer: Self-pay

## 2013-12-04 ENCOUNTER — Emergency Department (HOSPITAL_COMMUNITY)
Admission: EM | Admit: 2013-12-04 | Discharge: 2013-12-05 | Disposition: A | Discharge status: Home / Self Care | Payer: Self-pay | Attending: Emergency Medicine | Admitting: Emergency Medicine

## 2013-12-04 ENCOUNTER — Encounter (HOSPITAL_COMMUNITY): Payer: Self-pay | Admitting: Emergency Medicine

## 2013-12-04 DIAGNOSIS — Y939 Activity, unspecified: Secondary | ICD-10-CM | POA: Insufficient documentation

## 2013-12-04 DIAGNOSIS — Z8679 Personal history of other diseases of the circulatory system: Secondary | ICD-10-CM | POA: Insufficient documentation

## 2013-12-04 DIAGNOSIS — Z8709 Personal history of other diseases of the respiratory system: Secondary | ICD-10-CM | POA: Insufficient documentation

## 2013-12-04 DIAGNOSIS — IMO0002 Reserved for concepts with insufficient information to code with codable children: Secondary | ICD-10-CM | POA: Insufficient documentation

## 2013-12-04 DIAGNOSIS — S20219A Contusion of unspecified front wall of thorax, initial encounter: Secondary | ICD-10-CM | POA: Insufficient documentation

## 2013-12-04 DIAGNOSIS — F17200 Nicotine dependence, unspecified, uncomplicated: Secondary | ICD-10-CM | POA: Insufficient documentation

## 2013-12-04 DIAGNOSIS — W010XXA Fall on same level from slipping, tripping and stumbling without subsequent striking against object, initial encounter: Secondary | ICD-10-CM | POA: Insufficient documentation

## 2013-12-04 DIAGNOSIS — S20212A Contusion of left front wall of thorax, initial encounter: Secondary | ICD-10-CM

## 2013-12-04 DIAGNOSIS — Z880 Allergy status to penicillin: Secondary | ICD-10-CM | POA: Insufficient documentation

## 2013-12-04 DIAGNOSIS — Y9289 Other specified places as the place of occurrence of the external cause: Secondary | ICD-10-CM | POA: Insufficient documentation

## 2013-12-04 MED ORDER — OXYCODONE-ACETAMINOPHEN 5-325 MG PO TABS
1.0000 | ORAL_TABLET | Freq: Once | ORAL | Status: AC
  Administered 2013-12-05: 1 via ORAL
  Filled 2013-12-04: qty 1

## 2013-12-04 NOTE — ED Provider Notes (Signed)
CSN: [id number]     Arrival date & time 12/04/13  2215 History   First MD Initiated Contact with Patient 12/04/13 2238     Chief Complaint  Patient presents with  . Rib Injury     (Consider location/radiation/quality/duration/timing/severity/associated sxs/prior Treatment) HPI Comments: Mario Bautista is a 33 y.o. male who presents to the Emergency Department complaining of left lateral and posterior rib pain after a fall.  Patient reports that he slipped fell in the shower last evening.  Injury is > 24 hrs old.  He reports pain is worse today and associated with movement and deep breathing.  He denies shortness of breath,  head injury, LOC, abdominal pain, hematuria or neck pain.  Pain is slightly improved with rest. He has a hx of left pneumothorax approx 16 years ago w/o complications.   He has not taken any medications at home for pain or tried any other therapies prior to ED arrival.    The history is provided by the patient.    Past Medical History  Diagnosis Date  . Pneumothorax   . Migraines    Past Surgical History  Procedure Laterality Date  . Laparoscopic appendectomy  01/14/2012    Procedure: APPENDECTOMY LAPAROSCOPIC;  Surgeon: Fabio Bering, MD;  Location: [facility];  Service: General;  Laterality: Right;  . Appendectomy     Family History  Problem Relation Age of Onset  . Pseudochol deficiency Neg Hx   . Malignant hyperthermia Neg Hx   . Hypotension Neg Hx   . Anesthesia problems Neg Hx    History  Substance Use Topics  . Smoking status: Current Every Day Smoker -- 0.50 packs/day for 15 years    Types: Cigarettes  . Smokeless tobacco: Not on file  . Alcohol Use: Yes    Review of Systems  Constitutional: Negative for fever and chills.  Eyes: Negative for visual disturbance.  Respiratory: Negative for cough, chest tightness, shortness of breath and wheezing.   Cardiovascular: Positive for chest pain.       Left rib pain  Genitourinary: Negative for dysuria  and difficulty urinating.  Musculoskeletal: Positive for back pain. Negative for arthralgias, joint swelling and neck pain.  Skin: Negative for color change and wound.  Neurological: Negative for dizziness, syncope, weakness, numbness and headaches.  All other systems reviewed and are negative.      Allergies  Penicillins and Imitrex  Home Medications  No current outpatient prescriptions on file. BP 120/67  Pulse 79  Temp(Src) 97.9 F (36.6 C) (Oral)  Resp 20  SpO2 99% Physical Exam  Nursing note and vitals reviewed. Constitutional: He is oriented to person, place, and time. He appears well-developed and well-nourished. No distress.  HENT:  Head: Normocephalic and atraumatic.  Mouth/Throat: Oropharynx is clear and moist.  Neck: Normal range of motion, full passive range of motion without pain and phonation normal. Neck supple. No spinous process tenderness and no muscular tenderness present.  Cardiovascular: Normal rate, regular rhythm, normal heart sounds and intact distal pulses.   No murmur heard. Pulmonary/Chest: Effort normal and breath sounds normal. No respiratory distress. He has no wheezes. He has no rales. He exhibits tenderness and bony tenderness. He exhibits no laceration, no crepitus, no edema, no deformity, no swelling and no retraction.    Localized ttp of the mid left lateral and posterior chest wall.  No crepitus, edema, bruising or splinting on exam.   Abdominal: Soft. He exhibits no distension and no mass. There is no  tenderness. There is no rebound and no guarding.  Musculoskeletal: Normal range of motion.       Arms: ttp of the muscles along the lower left scapular border.  Pt has full ROM of the left arm and shoulder. Abduction of the arm reproduces chest wall tenderness.   Radial pulse brisk, distal sensation intact, grip strength strong and symmetrical  Neurological: He is alert and oriented to person, place, and time. He exhibits normal muscle tone.  Coordination normal.  Skin: Skin is warm and dry.    ED Course  Procedures (including critical care time) Labs Review Labs Reviewed - No data to display Imaging Review Dg Ribs Unilateral W/chest Left  12/04/2013   CLINICAL DATA:  Larey SeatFell in shower, rib pain  EXAM: LEFT RIBS AND CHEST - 3+ VIEW  COMPARISON:  DG CHEST 2 VIEW dated 09/19/2012  FINDINGS: Normal cardiac silhouette. No pneumothorax or pulmonary contusion. Dedicated views of the left ribs demonstrate no displaced rib fracture.  IMPRESSION: 1. No thoracic trauma. 2. No displaced left rib fracture. 3. No pneumothorax.   Electronically Signed   By: Genevive BiStewart  Edmunds M.D.   On: 12/04/2013 23:48     EKG Interpretation None      MDM   Final diagnoses:  Contusion of rib on left side    Patient has localized ttp of the left lateral and posterior ribs after a mechanical fall.  No crepitus, abrasions or bruising.  Patient is well appearing, VSS. No tachycardia, tachypnea , or hypoxia.  Lung sounds are CTA bilaterally.  Ambulates with a steady gait.    I have advised patient of XR results and discussed possibility of occult fx.  Pt agrees to symptomatic treatment including deep breathing and risk of pneumonia.  He agrees to close f/u and referral list given.  Return precautions also given.  Pt verbalized understanding and agreed to plan.    The patient appears reasonably screened and/or stabilized for discharge and I doubt any other medical condition or other [facility] requiring further screening, evaluation, or treatment in the ED at this time prior to discharge.        Jazz Rogala L. Trisha Mangleriplett, PA-C 12/05/13 [phone]

## 2013-12-04 NOTE — ED Notes (Signed)
Patient complaining of pain from left shoulder into left ribs. Reports fell in shower last night. Reports hard to take a deep breath.

## 2013-12-05 MED ORDER — IBUPROFEN 800 MG PO TABS: 800.0000 mg | ORAL_TABLET | Freq: Three times a day (TID) | ORAL | Status: DC

## 2013-12-05 MED ORDER — OXYCODONE-ACETAMINOPHEN 5-325 MG PO TABS: 1.0000 | ORAL_TABLET | ORAL | Status: DC | PRN

## 2013-12-05 NOTE — Discharge Instructions (Signed)
Rib Contusion A rib contusion (bruise) can occur by a blow to the chest or by a fall against a hard object. Usually these will be much better in a couple weeks. If X-rays were taken today and there are no broken bones (fractures), the diagnosis of bruising is made. However, broken ribs may not show up for several days, or may be discovered later on a routine X-ray when signs of healing show up. If this happens to you, it does not mean that something was missed on the X-ray, but simply that it did not show up on the first X-rays. Earlier diagnosis will not usually change the treatment. HOME CARE INSTRUCTIONS   Avoid strenuous activity. Be careful during activities and avoid bumping the injured ribs. Activities that pull on the injured ribs and cause pain should be avoided, if possible.  For the first day or two, an ice pack used every 20 minutes while awake may be helpful. Put ice in a plastic bag and put a towel between the bag and the skin.  Eat a normal, well-balanced diet. Drink plenty of fluids to avoid constipation.  Take deep breaths several times a day to keep lungs free of infection. Try to cough several times a day. Splint the injured area with a pillow while coughing to ease pain. Coughing can help prevent pneumonia.  Wear a rib belt or binder only if told to do so by your caregiver. If you are wearing a rib belt or binder, you must do the breathing exercises as directed by your caregiver. If not used properly, rib belts or binders restrict breathing which can lead to pneumonia.  Only take over-the-counter or prescription medicines for pain, discomfort, or fever as directed by your caregiver. SEEK MEDICAL CARE IF:   You or your child has an oral temperature above 102 F (38.9 C).  Your baby is older than 3 months with a rectal temperature of 100.5 F (38.1 C) or higher for more than 1 day.  You develop a cough, with thick or bloody sputum. SEEK IMMEDIATE MEDICAL CARE IF:   You  have difficulty breathing.  You feel sick to your stomach (nausea), have vomiting or belly (abdominal) pain.  You have worsening pain, not controlled with medications, or there is a change in the location of the pain.  You develop sweating or radiation of the pain into the arms, jaw or shoulders, or become light headed or faint.  You or your child has an oral temperature above 102 F (38.9 C), not controlled by medicine.  Your or your baby is older than 3 months with a rectal temperature of 102 F (38.9 C) or higher.  Your baby is 3 months old or younger with a rectal temperature of 100.4 F (38 C) or higher. MAKE SURE YOU:   Understand these instructions.  Will watch your condition.  Will get help right away if you are not doing well or get worse. Document Released: 05/28/2001 Document Revised: 12/28/2012 Document Reviewed: 04/20/2008 [employer] Patient Information 2014 [employer].  

## 2013-12-05 NOTE — ED Provider Notes (Signed)
Medical screening examination/treatment/procedure(s) were performed by non-physician practitioner and as supervising physician I was immediately available for consultation/collaboration.   EKG Interpretation None        Courtney F Horton, MD 12/05/13 0144 

## 2014-02-22 ENCOUNTER — Emergency Department (HOSPITAL_COMMUNITY)
Admission: EM | Admit: 2014-02-22 | Discharge: 2014-02-22 | Disposition: A | Discharge status: Home / Self Care | Payer: Self-pay | Attending: Emergency Medicine | Admitting: Emergency Medicine

## 2014-02-22 ENCOUNTER — Encounter (HOSPITAL_COMMUNITY): Payer: Self-pay | Admitting: Emergency Medicine

## 2014-02-22 DIAGNOSIS — Y939 Activity, unspecified: Secondary | ICD-10-CM | POA: Insufficient documentation

## 2014-02-22 DIAGNOSIS — Z880 Allergy status to penicillin: Secondary | ICD-10-CM | POA: Insufficient documentation

## 2014-02-22 DIAGNOSIS — F17200 Nicotine dependence, unspecified, uncomplicated: Secondary | ICD-10-CM | POA: Insufficient documentation

## 2014-02-22 DIAGNOSIS — Z8709 Personal history of other diseases of the respiratory system: Secondary | ICD-10-CM | POA: Insufficient documentation

## 2014-02-22 DIAGNOSIS — Y929 Unspecified place or not applicable: Secondary | ICD-10-CM | POA: Insufficient documentation

## 2014-02-22 DIAGNOSIS — Z8679 Personal history of other diseases of the circulatory system: Secondary | ICD-10-CM | POA: Insufficient documentation

## 2014-02-22 DIAGNOSIS — T1500XA Foreign body in cornea, unspecified eye, initial encounter: Secondary | ICD-10-CM | POA: Insufficient documentation

## 2014-02-22 DIAGNOSIS — T1590XA Foreign body on external eye, part unspecified, unspecified eye, initial encounter: Secondary | ICD-10-CM | POA: Insufficient documentation

## 2014-02-22 DIAGNOSIS — T1501XA Foreign body in cornea, right eye, initial encounter: Secondary | ICD-10-CM

## 2014-02-22 MED ORDER — KETOROLAC TROMETHAMINE 0.5 % OP SOLN
1.0000 [drp] | Freq: Once | OPHTHALMIC | Status: AC
  Administered 2014-02-22: 1 [drp] via OPHTHALMIC
  Filled 2014-02-22: qty 5

## 2014-02-22 MED ORDER — FLUORESCEIN SODIUM 1 MG OP STRP
1.0000 | ORAL_STRIP | Freq: Once | OPHTHALMIC | Status: AC
  Administered 2014-02-22: 1 via OPHTHALMIC
  Filled 2014-02-22: qty 1

## 2014-02-22 MED ORDER — TOBRAMYCIN 0.3 % OP SOLN
1.0000 [drp] | Freq: Once | OPHTHALMIC | Status: AC
  Administered 2014-02-22: 1 [drp] via OPHTHALMIC
  Filled 2014-02-22: qty 5

## 2014-02-22 MED ORDER — TETRACAINE HCL 0.5 % OP SOLN
2.0000 [drp] | Freq: Once | OPHTHALMIC | Status: AC
  Administered 2014-02-22: 2 [drp] via OPHTHALMIC
  Filled 2014-02-22: qty 2

## 2014-02-22 NOTE — ED Notes (Signed)
Rt eye pain since yesterday, thinks something flew into his eye yesterday.

## 2014-02-22 NOTE — Discharge Instructions (Signed)
Eye, Foreign Body  A foreign body is an object that should not be there. The object could be near, on, or in the eye.  HOME CARE  If your doctor prescribes an eye patch:  · Keep the eye patch on. Do this until you see your doctor again.  · Do not remove the patch to put in medicine unless your doctor tells you.  · Retape it as it was before:  · When replacing the patch.  · If the patch comes loose.  · Do not drive or use machinery.  · Only take medicine as told by your doctor.  If your doctor does not prescribe an eye patch:  · Keep the eye closed as much as possible.  · Do not rub the eye.  · Wear dark glasses in bright light.  · Do not wear contact lenses until the eye feels normal, or as told by your doctor.  · Wear protective eye covering, especially when using high speed tools.  · Only take medicine as told by your doctor.  GET HELP RIGHT AWAY IF:   · Your pain gets worse.  · Your vision changes.  · You have problems with the eye patch.  · The injury gets larger.  · There is fluid (discharge) coming from the eye.  · You get puffiness (swelling) and soreness.  · You have an oral temperature above 102° F (38.9° C), not controlled by medicine.  · Your baby is older than 3 months with a rectal temperature of 102° F (38.9° C) or higher.  · Your baby is 3 months old or younger with a rectal temperature of 100.4° F (38° C) or higher.  MAKE SURE YOU:   · Understand these instructions.  · Will watch your condition.  · Will get help right away if you are not doing well or get worse.  Document Released: 02/20/2010 Document Revised: 11/25/2011 Document Reviewed: 02/20/2010  [employer]® Patient Information ©2014 [employer].

## 2014-02-25 NOTE — ED Provider Notes (Signed)
Medical screening examination/treatment/procedure(s) were performed by non-physician practitioner and as supervising physician I was immediately available for consultation/collaboration.   EKG Interpretation None       Donnetta HutchingBrian Annemarie Sebree, MD 02/25/14 1945

## 2014-02-25 NOTE — ED Provider Notes (Signed)
CSN: [id number]     Arrival date & time 02/22/14  2107 History   First MD Initiated Contact with Patient 02/22/14 2216     Chief Complaint  Patient presents with  . Eye Pain     (Consider location/radiation/quality/duration/timing/severity/associated sxs/prior Treatment) Patient is a 33 y.o. male presenting with eye pain. The history is provided by the patient.  Eye Pain This is a new problem. The current episode started yesterday. The problem occurs constantly. The problem has been unchanged. Associated symptoms include a visual change. Pertinent negatives include no chest pain, congestion, coughing, fever, headaches, nausea, neck pain, numbness, rash, sore throat, swollen glands, vomiting or weakness. Exacerbated by: blinking. Treatments tried: rinsing his eye with water and using visine. The treatment provided no relief.    Past Medical History  Diagnosis Date  . Pneumothorax   . Migraines    Past Surgical History  Procedure Laterality Date  . Laparoscopic appendectomy  01/14/2012    Procedure: APPENDECTOMY LAPAROSCOPIC;  Surgeon: Fabio BeringBrent C Ziegler, MD;  Location: [facility];  Service: General;  Laterality: Right;  . Appendectomy     Family History  Problem Relation Age of Onset  . Pseudochol deficiency Neg Hx   . Malignant hyperthermia Neg Hx   . Hypotension Neg Hx   . Anesthesia problems Neg Hx    History  Substance Use Topics  . Smoking status: Current Every Day Smoker -- 0.50 packs/day for 15 years    Types: Cigarettes  . Smokeless tobacco: Not on file  . Alcohol Use: Yes    Review of Systems  Constitutional: Negative for fever, activity change and appetite change.  HENT: Negative for congestion, ear pain, rhinorrhea and sore throat.   Eyes: Positive for photophobia, pain, redness and visual disturbance. Negative for discharge and itching.  Respiratory: Negative for cough and shortness of breath.   Cardiovascular: Negative for chest pain.  Gastrointestinal: Negative for  nausea and vomiting.  Musculoskeletal: Negative for neck pain and neck stiffness.  Skin: Negative for rash.  Neurological: Negative for dizziness, syncope, facial asymmetry, speech difficulty, weakness, numbness and headaches.  All other systems reviewed and are negative.     Allergies  Penicillins and Imitrex  Home Medications   Prior to Admission medications   Not on File   BP 134/88  Pulse 92  Temp(Src) 97.8 F (36.6 C) (Oral)  Ht 6' (1.829 m)  Wt 130 lb (58.968 kg)  BMI 17.63 kg/m2  SpO2 99% Physical Exam  Nursing note and vitals reviewed. Constitutional: He is oriented to person, place, and time. He appears well-developed and well-nourished. No distress.  HENT:  Head: Normocephalic and atraumatic.  Mouth/Throat: Uvula is midline, oropharynx is clear and moist and mucous membranes are normal.  Eyes: EOM are normal. Pupils are equal, round, and reactive to light. Lids are everted and swept, no foreign bodies found. Right conjunctiva is not injected. Left conjunctiva is not injected.  Slit lamp exam:      The right eye shows corneal abrasion, foreign body and fluorescein uptake. The right eye shows no corneal flare, no corneal ulcer, no hyphema and no hypopyon.  FB present within the cornea at the 9 o'clock position.  Slit lamp exam reveals negative Seidel's sign, no hyphema   Neck: Normal range of motion. Neck supple. No thyromegaly present.  Cardiovascular: Normal rate, regular rhythm, normal heart sounds and intact distal pulses.   No murmur heard. Pulmonary/Chest: Effort normal and breath sounds normal. No respiratory distress.  Musculoskeletal: Normal  range of motion.  Lymphadenopathy:    He has no cervical adenopathy.  Neurological: He is alert and oriented to person, place, and time. He exhibits normal muscle tone. Coordination normal.  Skin: Skin is warm and dry. No rash noted.    ED Course  FOREIGN BODY REMOVAL Date/Time: 02/22/2014 10:55 PM Performed by:  Trisha MangleRIPLETT, Herve Haug L. Authorized by: Maxwell CaulRIPLETT, Rhiley Tarver L. Consent: Verbal consent obtained. Consent given by: patient Patient understanding: patient states understanding of the procedure being performed Patient identity confirmed: verbally with patient and arm band Time out: Immediately prior to procedure a "time out" was called to verify the correct patient, procedure, equipment, support staff and site/side marked as required. Body area: eye Location details: right cornea Anesthesia: local infiltration Local anesthetic: tetracaine drops Anesthetic total: 5 drops Patient sedated: no Patient restrained: no Patient cooperative: yes Localization method: visualized and magnification Removal mechanism: moist cotton swab Eye examined with fluorescein. Fluorescein uptake. Corneal abrasion size: medium Corneal abrasion location: central No residual rust ring present. Dressing: antibiotic drops Depth: superficial Complexity: simple 1 objects recovered. Objects recovered: debris Post-procedure assessment: foreign body removed Patient tolerance: Patient tolerated the procedure well with no immediate complications.   (including critical care time) Labs Review Labs Reviewed - No data to display  Imaging Review No results found.   EKG Interpretation None      MDM   Final diagnoses:  Foreign body of cornea, right    Tobramycin and ketorolac drops disp, right eye irrigated with saline.  Pt is feeling better.  No remains FB seen.  No evidence for penetrating trauma.  Pt agrees to f/u with Dr. Lita MainsHaines for recheck.  Appears stable for d/c and agrees to plan  Visual Acuity - Bilateral Distance: 20/30 ; R Distance: 20/70 ; L Distance: 20/40      Sherle Mello L. Junice Fei, PA-C 02/25/14 0115

## 2016-02-03 IMAGING — CR DG RIBS W/ CHEST 3+V*L*
4 series · 4 of 4 positions shown · non-contrast
Comparison: DG CHEST 2 VIEW dated 09/19/2012

CLINICAL DATA: Fell in shower, rib pain

EXAM:
LEFT RIBS AND CHEST - 3+ VIEW

[view not recorded (1 of 4)]
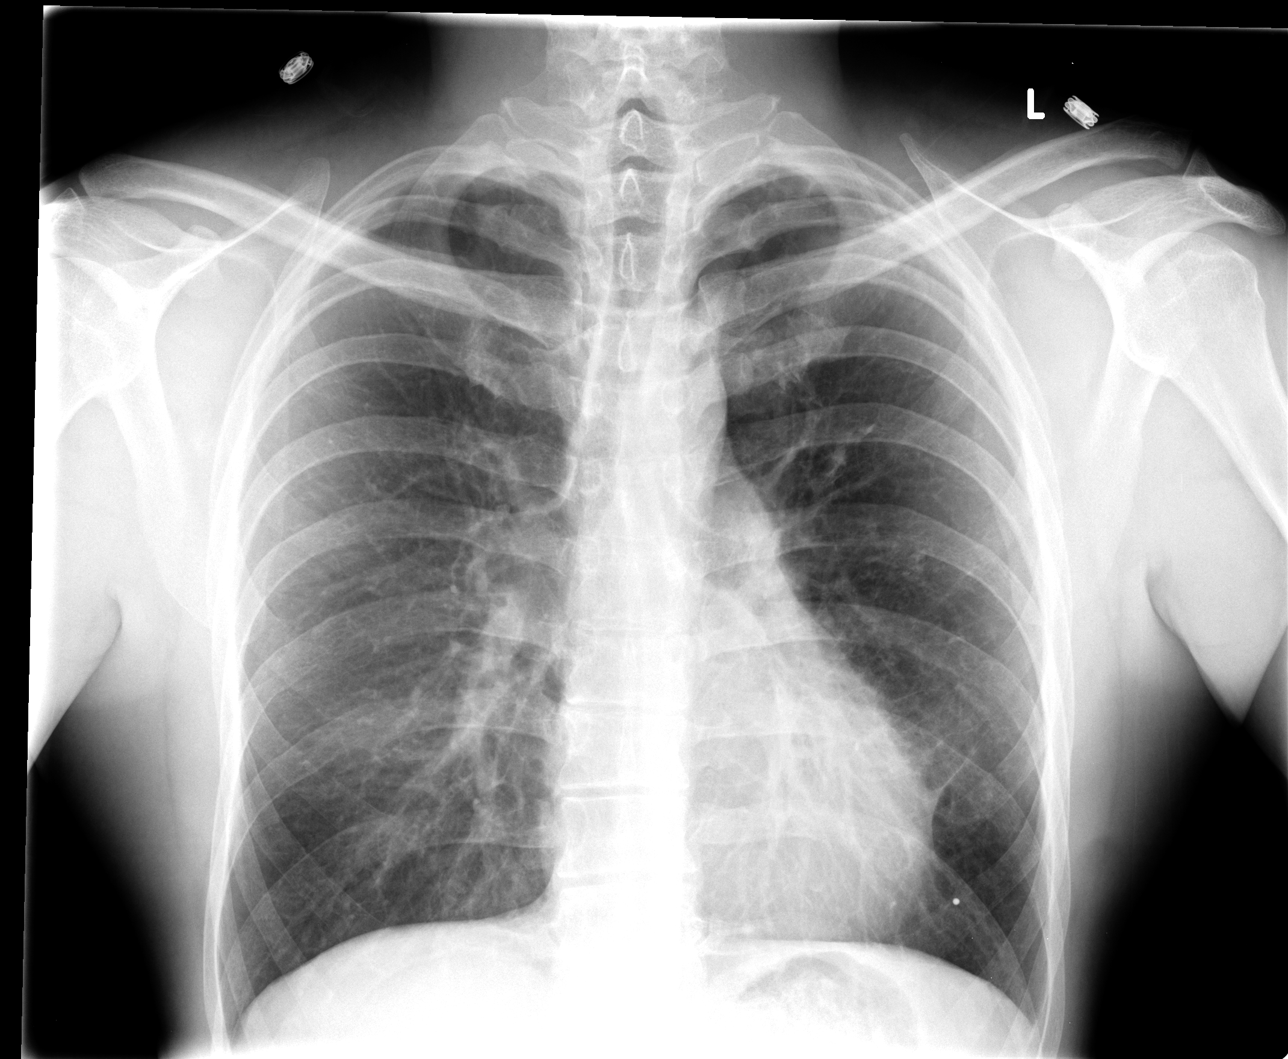

[view not recorded (2 of 4)]
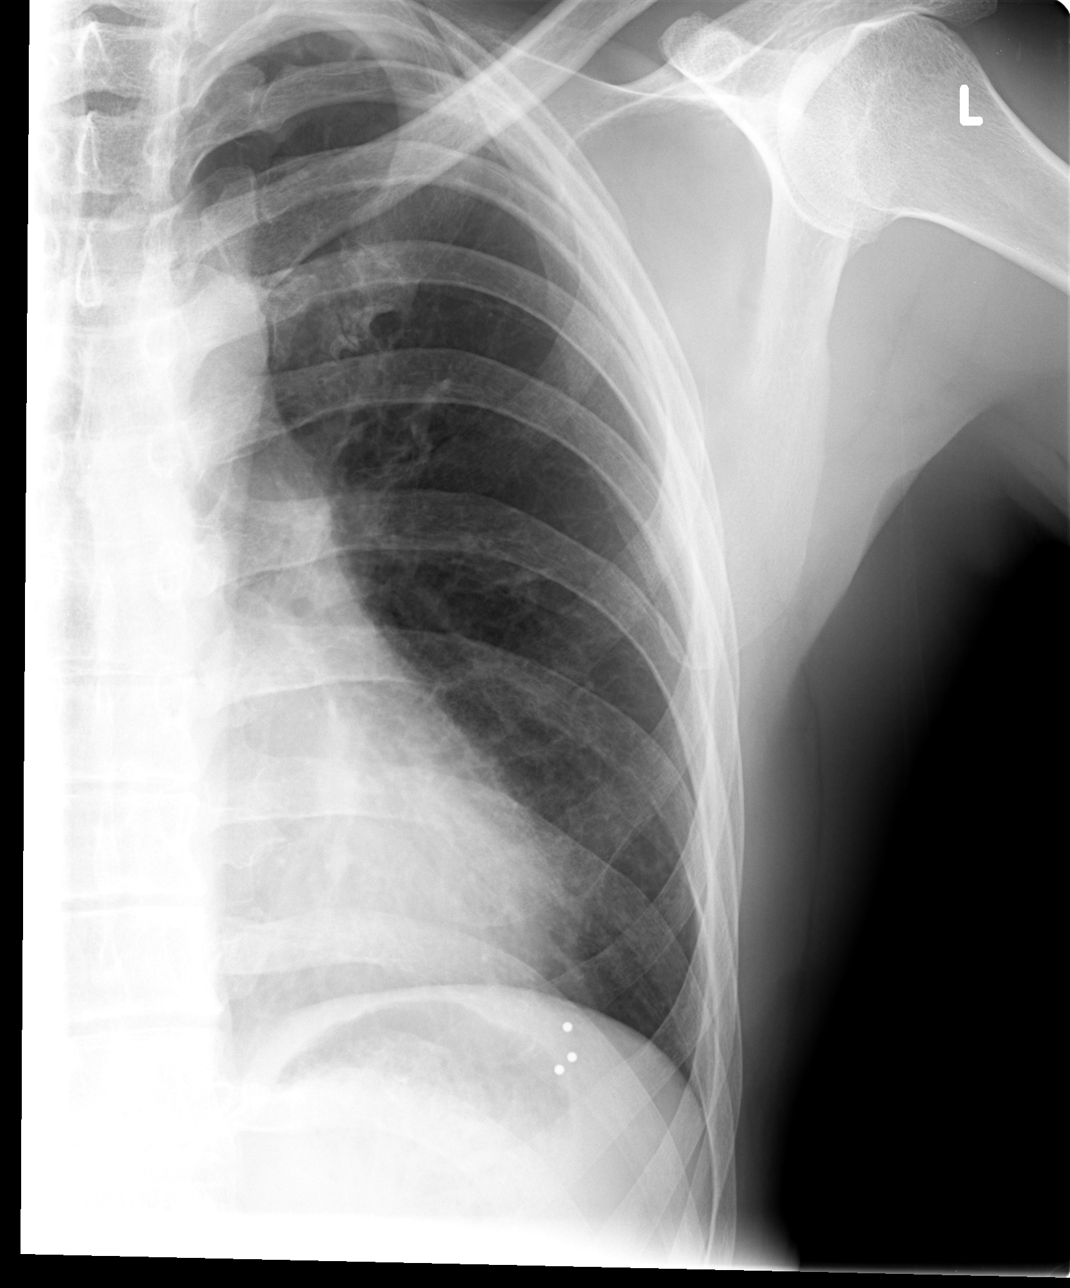

[view not recorded (3 of 4)]
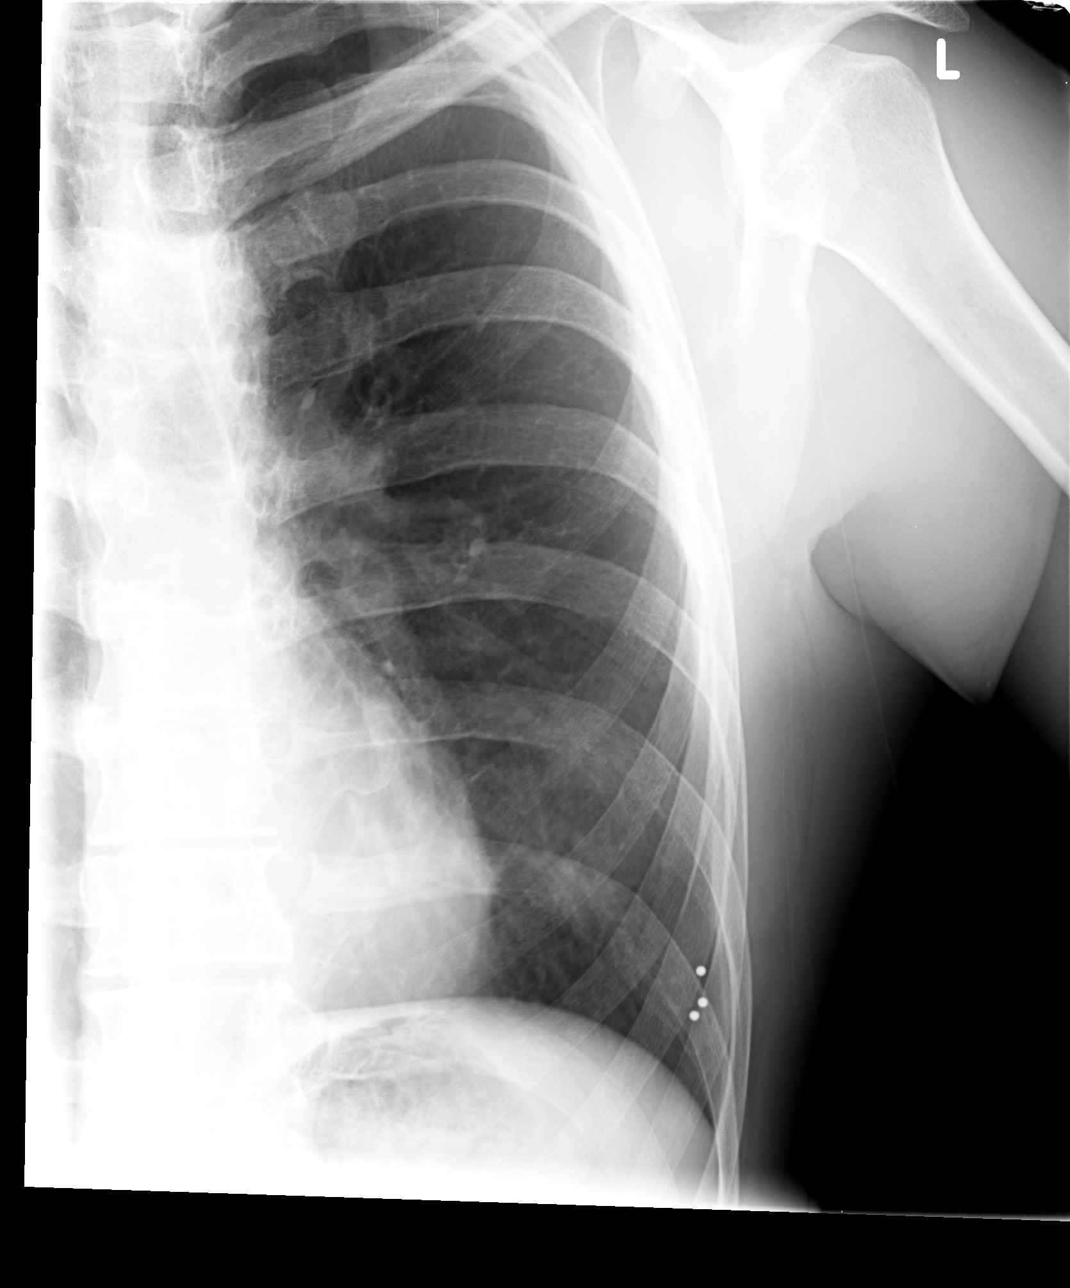

[view not recorded (4 of 4)]
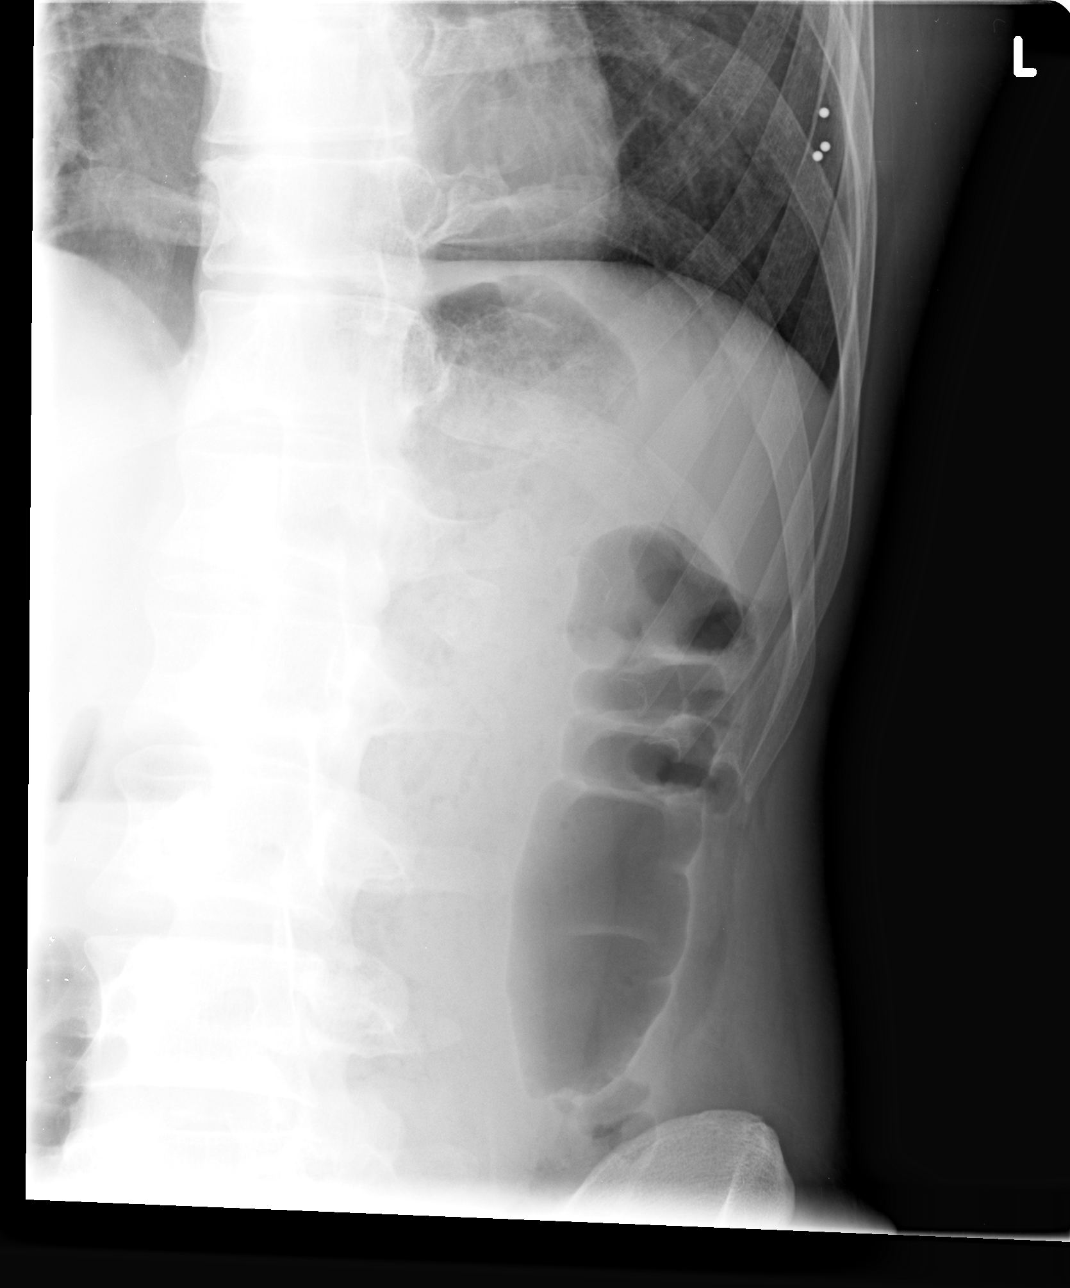

[4 of 4 positions shown; findings below may reference images not displayed]

FINDINGS: Normal cardiac silhouette. No pneumothorax or pulmonary contusion.
Dedicated views of the left ribs demonstrate no displaced rib
fracture.
IMPRESSION: 1. No thoracic trauma.
2. No displaced left rib fracture.
3. No pneumothorax.

## 2022-05-08 ENCOUNTER — Emergency Department (HOSPITAL_COMMUNITY): Payer: 59

## 2022-05-08 ENCOUNTER — Encounter (HOSPITAL_COMMUNITY): Payer: Self-pay

## 2022-05-08 ENCOUNTER — Emergency Department (HOSPITAL_COMMUNITY)
Admission: EM | Admit: 2022-05-08 | Discharge: 2022-05-08 | Disposition: A | Discharge status: Home / Self Care | Payer: 59 | Attending: Emergency Medicine | Admitting: Emergency Medicine

## 2022-05-08 ENCOUNTER — Other Ambulatory Visit: Payer: Self-pay

## 2022-05-08 DIAGNOSIS — R109 Unspecified abdominal pain: Secondary | ICD-10-CM | POA: Insufficient documentation

## 2022-05-08 LAB — CBC WITH DIFFERENTIAL/PLATELET
Abs Immature Granulocytes: 0.01 10*3/uL (ref 0.00–0.07)
Basophils Absolute: 0.1 10*3/uL (ref 0.0–0.1)
Basophils Relative: 1 %
Eosinophils Absolute: 0.2 10*3/uL (ref 0.0–0.5)
Eosinophils Relative: 2 %
HCT: 43 % (ref 39.0–52.0)
Hemoglobin: 14.1 g/dL (ref 13.0–17.0)
Immature Granulocytes: 0 %
Lymphocytes Relative: 32 %
Lymphs Abs: 2.5 10*3/uL (ref 0.7–4.0)
MCH: 29.3 pg (ref 26.0–34.0)
MCHC: 32.8 g/dL (ref 30.0–36.0)
MCV: 89.4 fL (ref 80.0–100.0)
Monocytes Absolute: 1 10*3/uL (ref 0.1–1.0)
Monocytes Relative: 13 %
Neutro Abs: 4.1 10*3/uL (ref 1.7–7.7)
Neutrophils Relative %: 52 %
Platelets: 337 10*3/uL (ref 150–400)
RBC: 4.81 MIL/uL (ref 4.22–5.81)
RDW: 12.5 % (ref 11.5–15.5)
WBC: 7.9 10*3/uL (ref 4.0–10.5)
nRBC: 0 % (ref 0.0–0.2)

## 2022-05-08 LAB — URINALYSIS, ROUTINE W REFLEX MICROSCOPIC
Bacteria, UA: NONE SEEN
Bilirubin Urine: NEGATIVE
Glucose, UA: NEGATIVE mg/dL
Ketones, ur: NEGATIVE mg/dL
Leukocytes,Ua: NEGATIVE
Nitrite: NEGATIVE
Protein, ur: 30 mg/dL — AB
Specific Gravity, Urine: 1.024 (ref 1.005–1.030)
pH: 5 (ref 5.0–8.0)

## 2022-05-08 LAB — COMPREHENSIVE METABOLIC PANEL
ALT: 15 U/L (ref 0–44)
AST: 15 U/L (ref 15–41)
Albumin: 3.7 g/dL (ref 3.5–5.0)
Alkaline Phosphatase: 120 U/L (ref 38–126)
Anion gap: 6 (ref 5–15)
BUN: 19 mg/dL (ref 6–20)
CO2: 31 mmol/L (ref 22–32)
Calcium: 8.9 mg/dL (ref 8.9–10.3)
Chloride: 99 mmol/L (ref 98–111)
Creatinine, Ser: 1.06 mg/dL (ref 0.61–1.24)
GFR, Estimated: >60 mL/min (ref >60)
Glucose, Bld: 113 mg/dL — ABNORMAL HIGH (ref 70–99)
Potassium: 4.5 mmol/L (ref 3.5–5.1)
Sodium: 136 mmol/L (ref 135–145)
Total Bilirubin: 0.8 mg/dL (ref 0.3–1.2)
Total Protein: 7.5 g/dL (ref 6.5–8.1)

## 2022-05-08 MED ORDER — HYDROMORPHONE HCL 1 MG/ML IJ SOLN
0.5000 mg | Freq: Once | INTRAMUSCULAR | Status: AC
  Administered 2022-05-08: 0.5 mg via INTRAVENOUS
  Filled 2022-05-08: qty 0.5

## 2022-05-08 MED ORDER — OXYCODONE-ACETAMINOPHEN 5-325 MG PO TABS: 1.0000 | ORAL_TABLET | Freq: Four times a day (QID) | ORAL | 0 refills | Status: AC | PRN

## 2022-05-08 MED ORDER — ONDANSETRON HCL 4 MG/2ML IJ SOLN
4.0000 mg | Freq: Once | INTRAMUSCULAR | Status: AC
  Administered 2022-05-08: 4 mg via INTRAVENOUS
  Filled 2022-05-08: qty 2

## 2022-05-08 MED ORDER — TAMSULOSIN HCL 0.4 MG PO CAPS: 0.4000 mg | ORAL_CAPSULE | Freq: Every day | ORAL | 0 refills | Status: AC

## 2022-05-08 MED ORDER — KETOROLAC TROMETHAMINE 30 MG/ML IJ SOLN
30.0000 mg | Freq: Once | INTRAMUSCULAR | Status: AC
  Administered 2022-05-08: 30 mg via INTRAVENOUS
  Filled 2022-05-08: qty 1

## 2022-05-08 MED ORDER — ONDANSETRON 4 MG PO TBDP: ORAL_TABLET | ORAL | 0 refills | Status: AC

## 2022-05-08 NOTE — ED Triage Notes (Signed)
"  Left flank pain, radiating from groin to lower back x 2 days, painful urination and constipation. Vomited x 1 yesterday"  per pt

## 2022-05-08 NOTE — ED Provider Notes (Signed)
[facility] EMERGENCY DEPARTMENT Provider Note   CSN: [id number] Arrival date & time: 05/08/22  0831     History {Add pertinent medical, surgical, social history, OB history to HPI:1} Chief Complaint  Patient presents with   Flank Pain    Mario Bautista is a 41 y.o. male.  Patient complains of right flank pain.  Patient does not have a medical problem   Flank Pain       Home Medications Prior to Admission medications   Medication Sig Start Date End Date Taking? Authorizing Provider  ondansetron (ZOFRAN-ODT) 4 MG disintegrating tablet 4mg  ODT q4 hours prn nausea/vomit 05/08/22  Yes 05/10/22, MD  oxyCODONE-acetaminophen (PERCOCET) 5-325 MG tablet Take 1 tablet by mouth every 6 (six) hours as needed. 05/08/22  Yes 05/10/22, MD  tamsulosin (FLOMAX) 0.4 MG CAPS capsule Take 1 capsule (0.4 mg total) by mouth daily. 05/08/22  Yes 05/10/22, MD      Allergies    Penicillins and Imitrex [sumatriptan base]    Review of Systems   Review of Systems  Genitourinary:  Positive for flank pain.    Physical Exam Updated Vital Signs BP (!) 187/105   Pulse 90   Temp 98 F (36.7 C) (Oral)   Resp 18   Ht 6' (1.829 m)   Wt 56.7 kg   SpO2 99%   BMI 16.95 kg/m  Physical Exam  ED Results / Procedures / Treatments   Labs (all labs ordered are listed, but only abnormal results are displayed) Labs Reviewed  COMPREHENSIVE METABOLIC PANEL - Abnormal; Notable for the following components:      Result Value   Glucose, Bld 113 (*)    All other components within normal limits  URINALYSIS, ROUTINE W REFLEX MICROSCOPIC - Abnormal; Notable for the following components:   APPearance HAZY (*)    Hgb urine dipstick SMALL (*)    Protein, ur 30 (*)    All other components within normal limits  URINE CULTURE  CBC WITH DIFFERENTIAL/PLATELET    EKG None  Radiology CT Renal Stone Study  Result Date: 05/08/2022 CLINICAL DATA:  Right flank pain EXAM: CT ABDOMEN AND PELVIS  WITHOUT CONTRAST TECHNIQUE: Multidetector CT imaging of the abdomen and pelvis was performed following the standard protocol without IV contrast. RADIATION DOSE REDUCTION: This exam was performed according to the departmental dose-optimization program which includes automated exposure control, adjustment of the mA and/or kV according to patient size and/or use of iterative reconstruction technique. COMPARISON:  None Available. FINDINGS: Lower chest: Minimal paraseptal emphysema. No focal pulmonary opacity. Normal heart size no pericardial effusion. Hepatobiliary: No focal liver abnormality is seen. No gallstones, gallbladder wall thickening, or biliary dilatation. Pancreas: Unremarkable. No pancreatic ductal dilatation or surrounding inflammatory changes. Spleen: Normal in size without focal abnormality. Adrenals/Urinary Tract: Numerous bilateral caliceal renal calculi, measuring up to 6 mm on the right and 5 mm on the left. No hydronephrosis. There is a 2 mm renal stone near the right UVJ (series 2, image 68; series 5, image 55). There is mild circumferential bladder wall thickening, likely related to under-distention. Stomach/Bowel: Stomach is within normal limits. Appendix appears normal. No evidence of bowel wall thickening, distention, or inflammatory changes. Vascular/Lymphatic: Aortic atherosclerosis. No enlarged abdominal or pelvic lymph nodes. Reproductive: Prostate is unremarkable. Other: No abdominal wall hernia or abnormality. No abdominopelvic ascites. Musculoskeletal: No acute or significant osseous findings. IMPRESSION: There is a 2 mm renal calculus near the right UVJ. There are additional numerous bilateral renal  calculi measuring up to 6 mm, without resulting hydronephrosis. Electronically Signed   By: Jacob Moores M.D.   On: 05/08/2022 09:47    Procedures Procedures  {Document cardiac monitor, telemetry assessment procedure when appropriate:1}  Medications Ordered in ED Medications   HYDROmorphone (DILAUDID) injection 0.5 mg (has no administration in time range)  ondansetron (ZOFRAN) injection 4 mg (has no administration in time range)  ketorolac (TORADOL) 30 MG/ML injection 30 mg (30 mg Intravenous Given 05/08/22 0944)  ondansetron (ZOFRAN) injection 4 mg (4 mg Intravenous Given 05/08/22 0944)    ED Course/ Medical Decision Making/ A&P                           Medical Decision Making Amount and/or Complexity of Data Reviewed Labs: ordered. Radiology: ordered.  Risk Prescription drug management.  Patient with a kidney stone and hypertension.  He is referred to urology and given Zofran and Flomax and Percocet.  He is also told to have his blood pressure rechecked in 1 to 2 weeks  {Document critical care time when appropriate:1} {Document review of labs and clinical decision tools ie heart score, Chads2Vasc2 etc:1}  {Document your independent review of radiology images, and any outside records:1} {Document your discussion with family members, caretakers, and with consultants:1} {Document social determinants of health affecting pt's care:1} {Document your decision making why or why not admission, treatments were needed:1} Final Clinical Impression(s) / ED Diagnoses Final diagnoses:  Flank pain    Rx / DC Orders ED Discharge Orders          Ordered    tamsulosin (FLOMAX) 0.4 MG CAPS capsule  Daily        05/08/22 1107    ondansetron (ZOFRAN-ODT) 4 MG disintegrating tablet        05/08/22 1107    oxyCODONE-acetaminophen (PERCOCET) 5-325 MG tablet  Every 6 hours PRN        05/08/22 1107

## 2022-05-08 NOTE — Discharge Instructions (Addendum)
Follow-up with [facility] next week.  Get your blood pressure checked in the next week or 2.  return if any problem

## 2022-05-09 LAB — URINE CULTURE: Culture: NO GROWTH

## 2023-03-04 ENCOUNTER — Emergency Department (HOSPITAL_COMMUNITY): Payer: Self-pay

## 2023-03-04 ENCOUNTER — Emergency Department (HOSPITAL_COMMUNITY)
Admission: EM | Admit: 2023-03-04 | Discharge: 2023-03-04 | Disposition: A | Discharge status: Home / Self Care | Payer: Self-pay | Attending: Emergency Medicine | Admitting: Emergency Medicine

## 2023-03-04 ENCOUNTER — Other Ambulatory Visit: Payer: Self-pay

## 2023-03-04 ENCOUNTER — Encounter (HOSPITAL_COMMUNITY): Payer: Self-pay | Admitting: Emergency Medicine

## 2023-03-04 DIAGNOSIS — R600 Localized edema: Secondary | ICD-10-CM | POA: Insufficient documentation

## 2023-03-04 DIAGNOSIS — L03116 Cellulitis of left lower limb: Secondary | ICD-10-CM | POA: Insufficient documentation

## 2023-03-04 LAB — CBC WITH DIFFERENTIAL/PLATELET
Abs Immature Granulocytes: 0.02 10*3/uL (ref 0.00–0.07)
Basophils Absolute: 0.1 10*3/uL (ref 0.0–0.1)
Basophils Relative: 1 %
Eosinophils Absolute: 0.2 10*3/uL (ref 0.0–0.5)
Eosinophils Relative: 2 %
HCT: 36.5 % — ABNORMAL LOW (ref 39.0–52.0)
Hemoglobin: 11.9 g/dL — ABNORMAL LOW (ref 13.0–17.0)
Immature Granulocytes: 0 %
Lymphocytes Relative: 22 %
Lymphs Abs: 2.3 10*3/uL (ref 0.7–4.0)
MCH: 29.1 pg (ref 26.0–34.0)
MCHC: 32.6 g/dL (ref 30.0–36.0)
MCV: 89.2 fL (ref 80.0–100.0)
Monocytes Absolute: 1.1 10*3/uL — ABNORMAL HIGH (ref 0.1–1.0)
Monocytes Relative: 11 %
Neutro Abs: 6.7 10*3/uL (ref 1.7–7.7)
Neutrophils Relative %: 64 %
Platelets: 463 10*3/uL — ABNORMAL HIGH (ref 150–400)
RBC: 4.09 MIL/uL — ABNORMAL LOW (ref 4.22–5.81)
RDW: 12.8 % (ref 11.5–15.5)
WBC: 10.5 10*3/uL (ref 4.0–10.5)
nRBC: 0 % (ref 0.0–0.2)

## 2023-03-04 LAB — COMPREHENSIVE METABOLIC PANEL
ALT: 39 U/L (ref 0–44)
AST: 19 U/L (ref 15–41)
Albumin: 3.3 g/dL — ABNORMAL LOW (ref 3.5–5.0)
Alkaline Phosphatase: 127 U/L — ABNORMAL HIGH (ref 38–126)
Anion gap: 9 (ref 5–15)
BUN: 17 mg/dL (ref 6–20)
CO2: 28 mmol/L (ref 22–32)
Calcium: 8.7 mg/dL — ABNORMAL LOW (ref 8.9–10.3)
Chloride: 101 mmol/L (ref 98–111)
Creatinine, Ser: 0.95 mg/dL (ref 0.61–1.24)
GFR, Estimated: >60 mL/min (ref >60)
Glucose, Bld: 123 mg/dL — ABNORMAL HIGH (ref 70–99)
Potassium: 3.9 mmol/L (ref 3.5–5.1)
Sodium: 138 mmol/L (ref 135–145)
Total Bilirubin: 0.6 mg/dL (ref 0.3–1.2)
Total Protein: 7.1 g/dL (ref 6.5–8.1)

## 2023-03-04 LAB — PROTIME-INR
INR: 0.9 (ref 0.8–1.2)
Prothrombin Time: 12.6 seconds (ref 11.4–15.2)

## 2023-03-04 MED ORDER — FENTANYL CITRATE PF 50 MCG/ML IJ SOSY
50.0000 ug | PREFILLED_SYRINGE | Freq: Once | INTRAMUSCULAR | Status: AC
  Administered 2023-03-04: 50 ug via INTRAVENOUS
  Filled 2023-03-04: qty 1

## 2023-03-04 MED ORDER — OXYCODONE HCL 5 MG PO TABS: 2.5000 mg | ORAL_TABLET | ORAL | 0 refills | Status: AC | PRN

## 2023-03-04 MED ORDER — DOXYCYCLINE HYCLATE 100 MG PO CAPS: 100.0000 mg | ORAL_CAPSULE | Freq: Two times a day (BID) | ORAL | 0 refills | Status: AC

## 2023-03-04 MED ORDER — CELECOXIB 200 MG PO CAPS: 200.0000 mg | ORAL_CAPSULE | Freq: Two times a day (BID) | ORAL | 0 refills | Status: AC

## 2023-03-04 MED ORDER — ONDANSETRON HCL 4 MG/2ML IJ SOLN
4.0000 mg | Freq: Once | INTRAMUSCULAR | Status: AC
Start: 2023-03-04 — End: 2023-03-04
  Administered 2023-03-04: 4 mg via INTRAVENOUS
  Filled 2023-03-04: qty 2
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
## 2023-03-04 NOTE — ED Notes (Signed)
Pt's God mother, Loni Dolly, states to call her when pt is up for discharge as he does not have a cell phone or a car, states number is [phone]

## 2023-03-04 NOTE — ED Triage Notes (Signed)
Pt c/o unknown insect bite to inner left ankle, first noticed this morning. Area is reddened and swollen and pt reports pain rated 10/10.

## 2023-03-04 NOTE — Discharge Instructions (Signed)
Contact a health care provider if: You have a fever. Your symptoms do not improve within 1-2 days of starting treatment or you develop new symptoms. Your bone or joint underneath the infected area becomes painful after the skin has healed. Your infection returns in the same area or another area. Signs of this may include: You notice a swollen bump in the infected area. Your red area gets larger, turns dark in color, or becomes more painful. Drainage increases. Pus or a bad smell develops in your infected area. You have more pain. You feel ill and have muscle aches and weakness. You develop vomiting or diarrhea that will not go away. Get help right away if: You notice red streaks coming from the infected area. You notice the skin turns purple or black and falls off. 

## 2023-06-04 ENCOUNTER — Ambulatory Visit (HOSPITAL_COMMUNITY): Payer: Self-pay | Admitting: Physical Therapy

## 2023-06-06 ENCOUNTER — Ambulatory Visit (HOSPITAL_COMMUNITY): Payer: Self-pay

## 2023-06-10 ENCOUNTER — Ambulatory Visit (HOSPITAL_COMMUNITY): Payer: Self-pay | Admitting: Physical Therapy

## 2023-06-13 ENCOUNTER — Ambulatory Visit (HOSPITAL_COMMUNITY): Payer: Self-pay

## 2023-06-16 ENCOUNTER — Ambulatory Visit (HOSPITAL_COMMUNITY): Payer: Self-pay | Admitting: Physical Therapy

## 2023-06-19 ENCOUNTER — Ambulatory Visit (HOSPITAL_COMMUNITY): Payer: Medicaid Other | Admitting: Physical Therapy

## 2023-06-19 NOTE — Therapy (Deleted)
  OUTPATIENT PHYSICAL THERAPY Wound  EVALUATION   Patient Name: Mario Bautista MRN: [id number] DOB:March 12, 1981, 42 y.o., male Today's Date: 06/19/2023   PCP: Tarry Kos, MD REFERRING PROVIDER: Tarry Kos, MD  END OF SESSION:   Past Medical History:  Diagnosis Date   Migraines    Pneumothorax    Past Surgical History:  Procedure Laterality Date   APPENDECTOMY     LAPAROSCOPIC APPENDECTOMY  01/14/2012   Procedure: APPENDECTOMY LAPAROSCOPIC;  Surgeon: Fabio Bering, MD;  Location: [facility];  Service: General;  Laterality: Right;   There are no problems to display for this patient.   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  Rationale for Evaluation and Treatment: Rehabilitation       PATIENT EDUCATION: Education details: *** Person educated: {Person educated:25204} Education method: {Education Method:25205} Education comprehension: {Education Comprehension:25206}   HOME EXERCISE PROGRAM: ***   GOALS: Goals reviewed with patient? {yes/no:20286}  SHORT TERM GOALS: Target date: ***  *** Baseline: Goal status: {GOALSTATUS:25110}  2.  *** Baseline:  Goal status: {GOALSTATUS:25110}  3.  *** Baseline:  Goal status: {GOALSTATUS:25110}  4.  *** Baseline:  Goal status: {GOALSTATUS:25110}  5.  *** Baseline:  Goal status: {GOALSTATUS:25110}  6.  *** Baseline:  Goal status: {GOALSTATUS:25110}  LONG TERM GOALS: Target date: ***  *** Baseline:  Goal status: {GOALSTATUS:25110}  2.  *** Baseline:  Goal status: {GOALSTATUS:25110}  3.  *** Baseline:  Goal status: {GOALSTATUS:25110}  4.  *** Baseline:  Goal status: {GOALSTATUS:25110}  5.  *** Baseline:  Goal status: {GOALSTATUS:25110}  6.  *** Baseline:  Goal status: {GOALSTATUS:25110}  ASSESSMENT:  CLINICAL IMPRESSION: Patient is a ***  y.o. *** who was seen today for physical therapy evaluation and treatment for ***.    OBJECTIVE IMPAIRMENTS: {opptimpairments:25111}.   ACTIVITY LIMITATIONS: {activitylimitations:27494}  PARTICIPATION LIMITATIONS: {participationrestrictions:25113}  PERSONAL FACTORS: {Personal factors:25162} are also affecting patient's functional outcome.   REHAB POTENTIAL: {rehabpotential:25112}  CLINICAL DECISION MAKING: {clinical decision making:25114}  EVALUATION COMPLEXITY: {Evaluation complexity:25115}  PLAN: PT FREQUENCY: {rehab frequency:25116}  PT DURATION: {rehab duration:25117}  PLANNED INTERVENTIONS: {rehab planned interventions:25118::"Therapeutic exercises","Therapeutic activity","Neuromuscular re-education","Balance training","Gait training","Patient/Family education","Self Care","Joint mobilization"}  PLAN FOR NEXT SESSION: Virgina Organ, PT CLT [phone]  06/19/2023, 11:24 AM

## 2023-06-20 ENCOUNTER — Ambulatory Visit (HOSPITAL_COMMUNITY): Payer: Medicaid Other | Attending: Internal Medicine | Admitting: Physical Therapy

## 2023-06-20 NOTE — Therapy (Deleted)
  OUTPATIENT PHYSICAL THERAPY Wound EVALUATION   Patient Name: Mario Bautista MRN: [id number] DOB:08/01/1981, 42 y.o., male Today's Date: 06/20/2023   PCP: Tarry Kos, MD REFERRING PROVIDER: Tarry Kos, MD  END OF SESSION:   Past Medical History:  Diagnosis Date   Migraines    Pneumothorax    Past Surgical History:  Procedure Laterality Date   APPENDECTOMY     LAPAROSCOPIC APPENDECTOMY  01/14/2012   Procedure: APPENDECTOMY LAPAROSCOPIC;  Surgeon: Fabio Bering, MD;  Location: [facility];  Service: General;  Laterality: Right;   There are no problems to display for this patient.   ONSET DATE: 03/04/23    REFERRING DIAG: L98.499 (ICD-10-CM) - Non-pressure chronic ulcer of skin of other sites with unspecified severity  THERAPY DIAG: L98.499 (ICD-10-CM) - Non-pressure chronic ulcer of skin of other sites with unspecified severity   Rationale for Evaluation and Treatment: Rehabilitation       PATIENT EDUCATION: Education details: Dressing should stay dry, remove if it becomes wet.  Remove if it becomes painful Person educated: Patient Education method: Explanation and Verbal cues Education comprehension: verbalized understanding   HOME EXERCISE PROGRAM: none   GOALS: Goals reviewed with patient? No  SHORT TERM GOALS: Target date: 07/11/23  PT wound to be 100% granulated to prevent cellulitis.  Baseline: Goal status: INITIAL  2.  PT pain to be no greater than a  Baseline:  Goal status: INITIAL  LONG TERM GOALS: Target date: 08/01/23  Wound to be healed  Baseline:  Goal status: INITIAL  2.  PT to have no pain in his LE  Baseline:  Goal status: INITIAL    ASSESSMENT:  CLINICAL IMPRESSION: Patient is a 42 y.o. L98.499 (ICD-10-CM) - Non-pressure chronic ulcer of skin of other sites with unspecified severity who was seen today for physical therapy evaluation and treatment for a non healing ankle wound.    OBJECTIVE IMPAIRMENTS:  decreased activity tolerance, increased edema, pain, and decreased skin integrity  .   ACTIVITY LIMITATIONS: bathing, dressing, hygiene/grooming, and locomotion level    PERSONAL FACTORS: Time since onset of injury/illness/exacerbation are also affecting patient's functional outcome.   REHAB POTENTIAL: Good  CLINICAL DECISION MAKING: Evolving/moderate complexity  EVALUATION COMPLEXITY: Moderate  PLAN: PT FREQUENCY: 2x/week  PT DURATION: 6 weeks  PLANNED INTERVENTIONS: Therapeutic exercises, Patient/Family education, Self Care, Manual therapy, and debridement  PLAN FOR NEXT SESSION: continue debridement and dressing to promote a healing environment.    Virgina Organ, PT CLT [phone]  06/20/2023, 3:18 PM

## 2023-06-23 ENCOUNTER — Ambulatory Visit (HOSPITAL_COMMUNITY): Payer: Self-pay | Admitting: Physical Therapy

## 2023-06-26 ENCOUNTER — Ambulatory Visit (HOSPITAL_COMMUNITY): Payer: Self-pay | Admitting: Physical Therapy

## 2023-06-26 ENCOUNTER — Ambulatory Visit (HOSPITAL_COMMUNITY): Payer: Medicaid Other | Admitting: Physical Therapy

## 2023-07-01 ENCOUNTER — Ambulatory Visit (HOSPITAL_COMMUNITY): Payer: Medicaid Other | Admitting: Physical Therapy

## 2023-07-01 ENCOUNTER — Ambulatory Visit (HOSPITAL_COMMUNITY): Payer: Self-pay | Admitting: Physical Therapy

## 2023-07-03 ENCOUNTER — Ambulatory Visit (HOSPITAL_COMMUNITY): Payer: Medicaid Other | Admitting: Physical Therapy

## 2023-07-04 ENCOUNTER — Ambulatory Visit (HOSPITAL_COMMUNITY): Payer: Self-pay | Admitting: Physical Therapy

## 2023-07-08 ENCOUNTER — Ambulatory Visit (HOSPITAL_COMMUNITY): Payer: Medicaid Other | Admitting: Physical Therapy

## 2023-07-08 ENCOUNTER — Ambulatory Visit (HOSPITAL_COMMUNITY): Payer: Self-pay | Admitting: Physical Therapy

## 2023-07-10 ENCOUNTER — Ambulatory Visit (HOSPITAL_COMMUNITY): Payer: Medicaid Other | Admitting: Physical Therapy

## 2023-07-11 ENCOUNTER — Ambulatory Visit (HOSPITAL_COMMUNITY): Payer: Self-pay | Admitting: Physical Therapy

## 2023-07-15 ENCOUNTER — Ambulatory Visit (HOSPITAL_COMMUNITY): Payer: Medicaid Other | Admitting: Physical Therapy

## 2023-07-15 ENCOUNTER — Ambulatory Visit (HOSPITAL_COMMUNITY): Payer: Self-pay | Admitting: Physical Therapy

## 2023-07-17 ENCOUNTER — Ambulatory Visit (HOSPITAL_COMMUNITY): Payer: Medicaid Other | Admitting: Physical Therapy

## 2023-07-18 ENCOUNTER — Ambulatory Visit (HOSPITAL_COMMUNITY): Payer: Self-pay | Admitting: Physical Therapy

## 2023-07-21 ENCOUNTER — Ambulatory Visit (HOSPITAL_COMMUNITY): Payer: Medicaid Other | Admitting: Physical Therapy

## 2023-07-24 ENCOUNTER — Ambulatory Visit (HOSPITAL_COMMUNITY): Payer: Medicaid Other | Admitting: Physical Therapy

## 2024-03-07 ENCOUNTER — Encounter (HOSPITAL_COMMUNITY): Payer: Self-pay | Admitting: *Deleted

## 2024-03-07 ENCOUNTER — Emergency Department (HOSPITAL_COMMUNITY)
Admission: EM | Admit: 2024-03-07 | Discharge: 2024-03-07 | Disposition: A | Discharge status: Home / Self Care | Attending: Student | Admitting: Student

## 2024-03-07 ENCOUNTER — Other Ambulatory Visit: Payer: Self-pay

## 2024-03-07 DIAGNOSIS — Z4802 Encounter for removal of sutures: Secondary | ICD-10-CM | POA: Diagnosis present

## 2024-03-07 DIAGNOSIS — F17210 Nicotine dependence, cigarettes, uncomplicated: Secondary | ICD-10-CM | POA: Insufficient documentation

## 2024-03-07 NOTE — ED Provider Notes (Signed)
 [place] EMERGENCY DEPARTMENT AT [facility] Provider Note  CSN: [id number] Arrival date & time: 03/07/24 1153  Chief Complaint(s) Suture / Staple Removal  HPI Mario Bautista is a 43 y.o. male who presents emergency room for evaluation of staple removal.  Denies any additional medical complaints today.   Past Medical History Past Medical History:  Diagnosis Date   Migraines    Pneumothorax    There are no active problems to display for this patient.  Home Medication(s) Prior to Admission medications   Medication Sig Start Date End Date Taking? Authorizing Provider  celecoxib  (CELEBREX ) 200 MG capsule Take 1 capsule (200 mg total) by mouth 2 (two) times daily. 03/04/23   Harris, Abigail, PA-C  doxycycline  (VIBRAMYCIN ) 100 MG capsule Take 1 capsule (100 mg total) by mouth 2 (two) times daily. One po bid x 7 days 03/04/23   Harris, Abigail, PA-C  ondansetron  (ZOFRAN -ODT) 4 MG disintegrating tablet 4mg  ODT q4 hours prn nausea/vomit 05/08/22   Zammit, Joseph, MD  oxyCODONE  (ROXICODONE ) 5 MG immediate release tablet Take 0.5-1 tablets (2.5-5 mg total) by mouth every 4 (four) hours as needed for severe pain. 03/04/23   Harris, Abigail, PA-C  oxyCODONE -acetaminophen  (PERCOCET) 5-325 MG tablet Take 1 tablet by mouth every 6 (six) hours as needed. 05/08/22   Suzette Pac, MD  tamsulosin  (FLOMAX ) 0.4 MG CAPS capsule Take 1 capsule (0.4 mg total) by mouth daily. 05/08/22   Suzette Pac, MD                                                                                                                                    Past Surgical History Past Surgical History:  Procedure Laterality Date   APPENDECTOMY     LAPAROSCOPIC APPENDECTOMY  01/14/2012   Procedure: APPENDECTOMY LAPAROSCOPIC;  Surgeon: Thresa JAYSON Pulling, MD;  Location: [facility];  Service: General;  Laterality: Right;   Family History Family History  Problem Relation Age of Onset   Pseudochol deficiency Neg Hx    Malignant  hyperthermia Neg Hx    Hypotension Neg Hx    Anesthesia problems Neg Hx     Social History Social History   Tobacco Use   Smoking status: Every Day    Current packs/day: 0.50    Average packs/day: 0.5 packs/day for 20.0 years (10.0 ttl pk-yrs)    Types: Cigarettes  Substance Use Topics   Alcohol use: Yes   Drug use: Yes    Types: Marijuana   Allergies Penicillins and Imitrex [sumatriptan base]  Review of Systems Review of Systems  All other systems reviewed and are negative.   Physical Exam Vital Signs  I have reviewed the triage vital signs BP 115/76 (BP Location: Right Arm)   Pulse 63   Temp 97.7 F (36.5 C) (Temporal)   Resp 16   SpO2 98%   Physical Exam Constitutional:      General: He is not  in acute distress.    Appearance: Normal appearance.  HENT:     Head: Normocephalic and atraumatic.     Nose: No congestion or rhinorrhea.   Eyes:     General:        Right eye: No discharge.        Left eye: No discharge.     Extraocular Movements: Extraocular movements intact.     Pupils: Pupils are equal, round, and reactive to light.    Cardiovascular:     Rate and Rhythm: Normal rate and regular rhythm.     Heart sounds: No murmur heard. Pulmonary:     Effort: No respiratory distress.     Breath sounds: No wheezing or rales.  Abdominal:     General: There is no distension.     Tenderness: There is no abdominal tenderness.   Musculoskeletal:        General: Normal range of motion.     Cervical back: Normal range of motion.   Skin:    General: Skin is warm and dry.   Neurological:     General: No focal deficit present.     Mental Status: He is alert.     ED Results and Treatments Labs (all labs ordered are listed, but only abnormal results are displayed) Labs Reviewed - No data to display                                                                                                                        Radiology No results  found.  Pertinent labs & imaging results that were available during my care of the patient were reviewed by me and considered in my medical decision making (see MDM for details).  Medications Ordered in ED Medications - No data to display                                                                                                                                   Procedures Suture Removal  Date/Time: 03/08/2024 11:22 AM  Performed by: Albertina Dixon, MD Authorized by: Albertina Dixon, MD   Location:    Location:  Head/neck   Head/neck location:  Scalp Procedure details:    Wound appearance:  No signs of infection, clean and good wound healing   Number of staples removed:  2 Post-procedure details:    Post-removal:  No dressing applied   Procedure completion:  Tolerated  well, no immediate complications   (including critical care time)  Medical Decision Making / ED Course   This patient presents to the ED for concern of stable removal, this involves an extensive number of treatment options, and is a complaint that carries with it a high risk of complications and morbidity.  The differential diagnosis includes routine staple removal, wound infection, appropriate granulation  MDM: Patient seen emergency room for evaluation of staple removal.  Physical exam with 2 staples in the scalp with appropriate wound healing and no evidence of infection.  Staples removed without difficulty and patient discharged with outpatient follow-up.   Additional history obtained: -Additional history obtained from wife -External records from outside source obtained and reviewed including: Chart review including previous notes, labs, imaging, consultation notes     Medicines ordered and prescription drug management: No orders of the defined types were placed in this encounter.   -I have reviewed the patients home medicines and have made adjustments as needed  Critical  interventions none   Social Determinants of Health:  Factors impacting patients care include: none   Reevaluation: After the interventions noted above, I reevaluated the patient and found that they have :improved  Co morbidities that complicate the patient evaluation  Past Medical History:  Diagnosis Date   Migraines    Pneumothorax       Dispostion: I considered admission for this patient, but at this time he does not meet inpatient criteria for admission and will be discharged with outpatient follow-up     Final Clinical Impression(s) / ED Diagnoses Final diagnoses:  None     @PCDICTATION @    Albertina Dixon, MD 03/08/24 1123

## 2024-03-07 NOTE — ED Triage Notes (Signed)
 Pt here for staple removal to scalp. Denies any issues.

## 2024-06-09 ENCOUNTER — Encounter (HOSPITAL_COMMUNITY): Payer: Self-pay

## 2024-06-09 ENCOUNTER — Emergency Department (HOSPITAL_COMMUNITY)
Admission: EM | Admit: 2024-06-09 | Discharge: 2024-06-09 | Disposition: A | Discharge status: Home / Self Care | Attending: Emergency Medicine | Admitting: Emergency Medicine

## 2024-06-09 ENCOUNTER — Other Ambulatory Visit: Payer: Self-pay

## 2024-06-09 ENCOUNTER — Emergency Department (HOSPITAL_COMMUNITY)

## 2024-06-09 DIAGNOSIS — J029 Acute pharyngitis, unspecified: Secondary | ICD-10-CM | POA: Insufficient documentation

## 2024-06-09 DIAGNOSIS — R519 Headache, unspecified: Secondary | ICD-10-CM | POA: Diagnosis present

## 2024-06-09 DIAGNOSIS — R197 Diarrhea, unspecified: Secondary | ICD-10-CM | POA: Diagnosis not present

## 2024-06-09 DIAGNOSIS — R059 Cough, unspecified: Secondary | ICD-10-CM | POA: Diagnosis not present

## 2024-06-09 DIAGNOSIS — B349 Viral infection, unspecified: Secondary | ICD-10-CM

## 2024-06-09 LAB — RESP PANEL BY RT-PCR (RSV, FLU A&B, COVID)  RVPGX2
Influenza A by PCR: NEGATIVE
Influenza B by PCR: NEGATIVE
Resp Syncytial Virus by PCR: NEGATIVE
SARS Coronavirus 2 by RT PCR: NEGATIVE

## 2024-06-09 MED ORDER — CETIRIZINE-PSEUDOEPHEDRINE ER 5-120 MG PO TB12: 1.0000 | ORAL_TABLET | Freq: Every day | ORAL | 0 refills | Status: AC

## 2024-06-09 MED ORDER — FLUTICASONE PROPIONATE 50 MCG/ACT NA SUSP: 2.0000 | Freq: Every day | NASAL | 0 refills | Status: AC

## 2024-06-09 MED ORDER — NAPROXEN 250 MG PO TABS
500.0000 mg | ORAL_TABLET | Freq: Once | ORAL | Status: AC
  Administered 2024-06-09: 500 mg via ORAL
  Filled 2024-06-09: qty 2

## 2024-06-09 MED ORDER — PROMETHAZINE-DM 6.25-15 MG/5ML PO SYRP: 5.0000 mL | ORAL_SOLUTION | Freq: Four times a day (QID) | ORAL | 0 refills | Status: AC | PRN

## 2024-06-09 NOTE — Discharge Instructions (Signed)

## 2024-06-09 NOTE — ED Triage Notes (Addendum)
 Pt arrives ambulatory to ED with c/o head pressure since Monday night. With bad taste in his mouth. No vomiting, has had diarrhea.

## 2024-06-09 NOTE — ED Provider Notes (Signed)
 [place] EMERGENCY DEPARTMENT AT [facility] Provider Note   CSN: [id number] Arrival date & time: 06/09/24  1154     Patient presents with: Diarrhea   Mario Bautista is a 43 y.o. male.   Patient is a 43 year old male who presents emergency department the chief complaint of headache, sinus congestion, sore throat, productive cough and diarrhea which has been ongoing for approximate the past 2 days.  He denies any known sick contacts.  He does have intermittent fevers.  He denies any associated dizziness, lightheadedness or syncope.  He notes his symptoms are worse with bending over.  He denies any associated neck pain or back pain.  He has had no associated abnormal rashes.  He denies any chest pain or shortness of breath.  He has had no abdominal pain, nausea, vomiting.   Diarrhea Associated symptoms: headaches        Prior to Admission medications   Medication Sig Start Date End Date Taking? Authorizing Provider  celecoxib  (CELEBREX ) 200 MG capsule Take 1 capsule (200 mg total) by mouth 2 (two) times daily. 03/04/23   Harris, Abigail, PA-C  doxycycline  (VIBRAMYCIN ) 100 MG capsule Take 1 capsule (100 mg total) by mouth 2 (two) times daily. One po bid x 7 days 03/04/23   Harris, Abigail, PA-C  ondansetron  (ZOFRAN -ODT) 4 MG disintegrating tablet 4mg  ODT q4 hours prn nausea/vomit 05/08/22   Zammit, Joseph, MD  oxyCODONE  (ROXICODONE ) 5 MG immediate release tablet Take 0.5-1 tablets (2.5-5 mg total) by mouth every 4 (four) hours as needed for severe pain. 03/04/23   Harris, Abigail, PA-C  oxyCODONE -acetaminophen  (PERCOCET) 5-325 MG tablet Take 1 tablet by mouth every 6 (six) hours as needed. 05/08/22   Suzette Pac, MD  tamsulosin  (FLOMAX ) 0.4 MG CAPS capsule Take 1 capsule (0.4 mg total) by mouth daily. 05/08/22   Suzette Pac, MD    Allergies: Penicillins and Imitrex [sumatriptan base]    Review of Systems  HENT:  Positive for rhinorrhea, sinus pressure and sore throat.    Gastrointestinal:  Positive for diarrhea.  Neurological:  Positive for headaches.  All other systems reviewed and are negative.   Updated Vital Signs BP 137/83   Pulse 69   Temp 97.9 F (36.6 C) (Oral)   Resp 19   Ht 6' (1.829 m)   Wt 63.5 kg   SpO2 99%   BMI 18.99 kg/m   Physical Exam Vitals and nursing note reviewed.  Constitutional:      General: He is not in acute distress.    Appearance: Normal appearance. He is not ill-appearing.  HENT:     Head: Normocephalic and atraumatic.     Right Ear: Tympanic membrane, ear canal and external ear normal. There is no impacted cerumen.     Left Ear: Tympanic membrane, ear canal and external ear normal. There is no impacted cerumen.     Nose: Nose normal.     Mouth/Throat:     Mouth: Mucous membranes are moist.  Eyes:     Extraocular Movements: Extraocular movements intact.     Conjunctiva/sclera: Conjunctivae normal.     Pupils: Pupils are equal, round, and reactive to light.  Cardiovascular:     Rate and Rhythm: Normal rate and regular rhythm.     Pulses: Normal pulses.     Heart sounds: Normal heart sounds. No murmur heard.    No gallop.  Pulmonary:     Effort: Pulmonary effort is normal. No respiratory distress.     Breath  sounds: Normal breath sounds. No stridor. No wheezing, rhonchi or rales.  Abdominal:     General: Abdomen is flat. Bowel sounds are normal. There is no distension.     Palpations: Abdomen is soft.     Tenderness: There is no abdominal tenderness. There is no guarding.  Musculoskeletal:        General: Normal range of motion.     Cervical back: Normal range of motion and neck supple. No rigidity or tenderness.     Right lower leg: No edema.     Left lower leg: No edema.  Skin:    General: Skin is warm and dry.     Findings: No bruising or rash.  Neurological:     General: No focal deficit present.     Mental Status: He is alert and oriented to person, place, and time. Mental status is at  baseline.     Cranial Nerves: No cranial nerve deficit.     Sensory: No sensory deficit.     Motor: No weakness.     Coordination: Coordination normal.     Gait: Gait normal.  Psychiatric:        Mood and Affect: Mood normal.        Behavior: Behavior normal.        Thought Content: Thought content normal.        Judgment: Judgment normal.     (all labs ordered are listed, but only abnormal results are displayed) Labs Reviewed  RESP PANEL BY RT-PCR (RSV, FLU A&B, COVID)  [id number]    EKG: None  Radiology: No results found.   Procedures   Medications Ordered in the ED  naproxen  (NAPROSYN ) tablet 500 mg (has no administration in time range)                                    Medical Decision Making Patient is doing well at this time and is stable for discharge home.  Discussed with patient that x-ray of the chest demonstrated no indication for pneumonia and he was negative for COVID-19, influenza and RSV.  Still suspect an acute viral syndrome at this point.  Do not suspect that any further workup or imaging is warranted at this time.  Vital signs are stable with no indication for sepsis and no associated hypoxia.  Abdominal exam is benign with no focal tenderness throughout.  Close follow-up with primary care doctor was discussed as well as strict turn precautions for any new or worsening symptoms.  Patient voiced understanding and had no additional questions.  Amount and/or Complexity of Data Reviewed Radiology: ordered.  Risk OTC drugs. Prescription drug management.        Final diagnoses:  None    ED Discharge Orders     None          Daralene Lonni JONETTA DEVONNA 06/09/24 1545    Towana Ozell BROCKS, MD 06/09/24 [phone]

## 2024-08-06 ENCOUNTER — Encounter (HOSPITAL_COMMUNITY): Payer: Self-pay

## 2024-08-06 ENCOUNTER — Emergency Department (HOSPITAL_COMMUNITY)
Admission: EM | Admit: 2024-08-06 | Discharge: 2024-08-06 | Disposition: A | Discharge status: Home / Self Care | Attending: Emergency Medicine | Admitting: Emergency Medicine

## 2024-08-06 DIAGNOSIS — W268XXA Contact with other sharp object(s), not elsewhere classified, initial encounter: Secondary | ICD-10-CM | POA: Diagnosis not present

## 2024-08-06 DIAGNOSIS — S61210A Laceration without foreign body of right index finger without damage to nail, initial encounter: Secondary | ICD-10-CM | POA: Insufficient documentation

## 2024-08-06 DIAGNOSIS — S6991XA Unspecified injury of right wrist, hand and finger(s), initial encounter: Secondary | ICD-10-CM | POA: Diagnosis present

## 2024-08-06 MED ORDER — OXYCODONE HCL 5 MG PO TABS
5.0000 mg | ORAL_TABLET | Freq: Once | ORAL | Status: AC
  Administered 2024-08-06: 5 mg via ORAL
  Filled 2024-08-06: qty 1

## 2024-08-06 MED ORDER — POVIDONE-IODINE 10 % EX SOLN
CUTANEOUS | Status: AC
  Administered 2024-08-06: 1
  Filled 2024-08-06: qty 14.8

## 2024-08-06 MED ORDER — LIDOCAINE HCL (PF) 1 % IJ SOLN
5.0000 mL | Freq: Once | INTRAMUSCULAR | Status: AC
  Administered 2024-08-06: 5 mL
  Filled 2024-08-06: qty 5

## 2024-08-06 NOTE — Discharge Instructions (Addendum)
 You were evaluated in the emergency room for a laceration.  Please return to the emergency room in 7 to 10 days for suture removal.  If you experience any new or worsening symptoms including worsening pain, swelling, redness, drainage please return to emergency room earlier.  Please exchange her dressing daily and clean it regularly with soapy water.

## 2024-08-06 NOTE — ED Provider Notes (Signed)
 [place] EMERGENCY DEPARTMENT AT [facility] Provider Note   CSN: [id number] Arrival date & time: 08/06/24  1221     Patient presents with: Laceration (Right pointing finger)   Mario Bautista is a 43 y.o. male otherwise healthy presents with complaints of laceration.  Occurred this afternoon when he pinched his finger in the middle of the ladder.  No numbness or tingling.  No prior surgeries to the affected digit.  Up-to-date on tetanus.    Laceration     Past Medical History:  Diagnosis Date   Migraines    Pneumothorax    Past Surgical History:  Procedure Laterality Date   APPENDECTOMY     LAPAROSCOPIC APPENDECTOMY  01/14/2012   Procedure: APPENDECTOMY LAPAROSCOPIC;  Surgeon: Thresa JAYSON Pulling, MD;  Location: [facility];  Service: General;  Laterality: Right;     Prior to Admission medications   Medication Sig Start Date End Date Taking? Authorizing Provider  celecoxib  (CELEBREX ) 200 MG capsule Take 1 capsule (200 mg total) by mouth 2 (two) times daily. 03/04/23   Harris, Abigail, PA-C  cetirizine -pseudoephedrine  (ZYRTEC -D) 5-120 MG tablet Take 1 tablet by mouth daily. 06/09/24   Daralene Lonni BIRCH, PA-C  doxycycline  (VIBRAMYCIN ) 100 MG capsule Take 1 capsule (100 mg total) by mouth 2 (two) times daily. One po bid x 7 days 03/04/23   Harris, Abigail, PA-C  fluticasone  (FLONASE ) 50 MCG/ACT nasal spray Place 2 sprays into both nostrils daily. 06/09/24   Daralene Lonni BIRCH, PA-C  ondansetron  (ZOFRAN -ODT) 4 MG disintegrating tablet 4mg  ODT q4 hours prn nausea/vomit 05/08/22   Zammit, Joseph, MD  oxyCODONE  (ROXICODONE ) 5 MG immediate release tablet Take 0.5-1 tablets (2.5-5 mg total) by mouth every 4 (four) hours as needed for severe pain. 03/04/23   Harris, Abigail, PA-C  oxyCODONE -acetaminophen  (PERCOCET) 5-325 MG tablet Take 1 tablet by mouth every 6 (six) hours as needed. 05/08/22   Zammit, Joseph, MD  promethazine -dextromethorphan (PROMETHAZINE -DM) 6.25-15 MG/5ML syrup  Take 5 mLs by mouth 4 (four) times daily as needed. 06/09/24   Daralene Lonni BIRCH, PA-C  tamsulosin  (FLOMAX ) 0.4 MG CAPS capsule Take 1 capsule (0.4 mg total) by mouth daily. 05/08/22   Suzette Pac, MD    Allergies: Penicillins and Imitrex [sumatriptan base]    Review of Systems  Skin:  Positive for wound.    Updated Vital Signs BP (!) 141/97 (BP Location: Right Arm)   Pulse 82   Temp 98.1 F (36.7 C) (Oral)   Resp 14   Ht 6' (1.829 m)   Wt 65.8 kg   SpO2 98%   BMI 19.67 kg/m   Physical Exam Vitals and nursing note reviewed.  Constitutional:      General: He is not in acute distress.    Appearance: He is well-developed.  HENT:     Head: Normocephalic and atraumatic.  Eyes:     Conjunctiva/sclera: Conjunctivae normal.  Cardiovascular:     Rate and Rhythm: Normal rate.     Pulses: Normal pulses.  Pulmonary:     Effort: Pulmonary effort is normal. No respiratory distress.  Musculoskeletal:     Cervical back: Neck supple.     Comments: 1 to 2 cm laceration along the palmar aspect of the right index finger.  Hemostasis is achieved.  Tolerates full range of motion of the digit without discomfort.  Radial pulses 2+, cap refill less than 2 secs  Skin:    General: Skin is warm and dry.     Capillary Refill: Capillary  refill takes less than 2 seconds.  Neurological:     Mental Status: He is alert.  Psychiatric:        Mood and Affect: Mood normal.     (all labs ordered are listed, but only abnormal results are displayed) Labs Reviewed - No data to display  EKG: None  Radiology: No results found.   .Laceration Repair  Date/Time: 08/06/2024 1:53 PM  Performed by: Donnajean Lynwood DEL, PA-C Authorized by: Donnajean Lynwood DEL, PA-C   Consent:    Consent obtained:  Verbal   Consent given by:  Patient   Risks discussed:  Infection and pain Universal protocol:    Procedure explained and questions answered to patient or proxy's satisfaction: yes     Patient  identity confirmed:  Verbally with patient and arm band Anesthesia:    Anesthesia method:  Local infiltration   Local anesthetic:  Lidocaine  1% w/o epi Laceration details:    Location:  Finger   Finger location:  L index finger   Length (cm):  2 Treatment:    Area cleansed with:  Saline and povidone-iodine    Amount of cleaning:  Extensive Skin repair:    Repair method:  Sutures   Suture size:  4-0   Suture material:  Prolene   Number of sutures:  4 Repair type:    Repair type:  Simple Post-procedure details:    Procedure completion:  Tolerated    Medications Ordered in the ED  povidone-iodine  (BETADINE ) 10 % external solution (1 Application  Given 08/06/24 1237)  lidocaine  (PF) (XYLOCAINE ) 1 % injection 5 mL (5 mLs Infiltration Given 08/06/24 1300)  oxyCODONE  (Oxy IR/ROXICODONE ) immediate release tablet 5 mg (5 mg Oral Given 08/06/24 1300)    Clinical Course as of 08/06/24 1356  Fri Aug 06, 2024  1258 Patient evaluated for laceration that occurred today.  Upon arrival he is hemodynamically stable.  On exam he has a nearly 2 cm laceration on the palmar aspect of the right index finger.  Hemostasis is achieved.  He is neurovasc intact.  Will benefit from primary closure with sutures.  Low concern for fracture.  No evidence of retained foreign body. [JT]    Clinical Course User Index [JT] Donnajean Lynwood DEL, PA-C                                 Medical Decision Making  This patient presents to the ED with chief complaint(s) of laceration.  The complaint involves an extensive differential diagnosis and also carries with it a high risk of complications and morbidity.   Pertinent past medical history as listed in HPI  The differential diagnosis includes  Simple laceration versus tendon/vascular/nerve injury, fracture Additional history obtained: Records reviewed Care Everywhere/External Records  Disposition:   This patient presents to the ED with chief complaint(s) of  laceration .  The complaint involves an extensive differential diagnosis and also carries with it a high risk of complications and morbidity.   Pertinent past medical history as listed in HPI  The differential diagnosis includes  Fracture, retained foreign body, simple/vascular/nerve injury, simple laceration Additional history obtained: Records reviewed Care Everywhere/External Records  Disposition:   Patient will be discharged home. The patient has been appropriately medically screened and/or stabilized in the ED. I have low suspicion for any other emergent medical condition which would require further screening, evaluation or treatment in the ED or require inpatient management. At time of  discharge the patient is hemodynamically stable and in no acute distress. I have discussed work-up results and diagnosis with patient and answered all questions. Patient is agreeable with discharge plan. We discussed strict return precautions for returning to the emergency department and they verbalized understanding.     Social Determinants of Health:   none  This note was dictated with voice recognition software.  Despite best efforts at proofreading, errors may have occurred which can change the documentation meaning.    Social Determinants of Health:   nonee  This note was dictated with voice recognition software.  Despite best efforts at proofreading, errors may have occurred which can change the documentation meaning.       Final diagnoses:  Laceration of right index finger without foreign body without damage to nail, initial encounter    ED Discharge Orders     None          Donnajean Lynwood VEAR DEVONNA 08/06/24 1356    Haviland, Julie, MD 08/06/24 1436

## 2024-08-06 NOTE — ED Triage Notes (Signed)
 Pt comes in for right pointing finger laceration.   PMS intact. Pt finger is soaking in water during triage. A&Ox4.

## 2024-08-13 ENCOUNTER — Other Ambulatory Visit: Payer: Self-pay

## 2024-08-13 ENCOUNTER — Encounter (HOSPITAL_COMMUNITY): Payer: Self-pay | Admitting: *Deleted

## 2024-08-13 ENCOUNTER — Emergency Department (HOSPITAL_COMMUNITY)
Admission: EM | Admit: 2024-08-13 | Discharge: 2024-08-13 | Disposition: A | Discharge status: Home / Self Care | Attending: Emergency Medicine | Admitting: Emergency Medicine

## 2024-08-13 DIAGNOSIS — S61211D Laceration without foreign body of left index finger without damage to nail, subsequent encounter: Secondary | ICD-10-CM | POA: Diagnosis not present

## 2024-08-13 DIAGNOSIS — Z4802 Encounter for removal of sutures: Secondary | ICD-10-CM | POA: Diagnosis present

## 2024-08-13 DIAGNOSIS — X58XXXD Exposure to other specified factors, subsequent encounter: Secondary | ICD-10-CM | POA: Diagnosis not present

## 2024-08-13 NOTE — ED Provider Notes (Signed)
 [place] EMERGENCY DEPARTMENT AT [facility] Provider Note   CSN: [id number] Arrival date & time: 08/13/24  1643     Patient presents with: Suture / Staple Removal   Mario Bautista is a 43 y.o. male.    Suture / Staple Removal       Mario Bautista is a 43 y.o. male who presents to the Emergency Department questing suture removal.  He was seen here 1 week ago for laceration of his left index finger.  He states the area has been healing well.  He denies any numbness or tingling of his finger or difficulty with movement.  No redness, drainage fever chills or red streaking of the finger.   Prior to Admission medications   Medication Sig Start Date End Date Taking? Authorizing Provider  celecoxib  (CELEBREX ) 200 MG capsule Take 1 capsule (200 mg total) by mouth 2 (two) times daily. 03/04/23   Harris, Abigail, PA-C  cetirizine -pseudoephedrine  (ZYRTEC -D) 5-120 MG tablet Take 1 tablet by mouth daily. 06/09/24   Daralene Lonni BIRCH, PA-C  doxycycline  (VIBRAMYCIN ) 100 MG capsule Take 1 capsule (100 mg total) by mouth 2 (two) times daily. One po bid x 7 days 03/04/23   Harris, Abigail, PA-C  fluticasone  (FLONASE ) 50 MCG/ACT nasal spray Place 2 sprays into both nostrils daily. 06/09/24   Daralene Lonni BIRCH, PA-C  ondansetron  (ZOFRAN -ODT) 4 MG disintegrating tablet 4mg  ODT q4 hours prn nausea/vomit 05/08/22   Zammit, Joseph, MD  oxyCODONE  (ROXICODONE ) 5 MG immediate release tablet Take 0.5-1 tablets (2.5-5 mg total) by mouth every 4 (four) hours as needed for severe pain. 03/04/23   Harris, Abigail, PA-C  oxyCODONE -acetaminophen  (PERCOCET) 5-325 MG tablet Take 1 tablet by mouth every 6 (six) hours as needed. 05/08/22   Zammit, Joseph, MD  promethazine -dextromethorphan (PROMETHAZINE -DM) 6.25-15 MG/5ML syrup Take 5 mLs by mouth 4 (four) times daily as needed. 06/09/24   Daralene Lonni BIRCH, PA-C  tamsulosin  (FLOMAX ) 0.4 MG CAPS capsule Take 1 capsule (0.4 mg total) by mouth daily.  05/08/22   Suzette Pac, MD    Allergies: Penicillins and Imitrex [sumatriptan base]    Review of Systems  Skin:        Laceration distal left index finger, sutures in place  All other systems reviewed and are negative.   Updated Vital Signs BP 133/85 (BP Location: Right Arm)   Pulse 82   Temp 99 F (37.2 C) (Temporal)   Resp 16   Ht 6' (1.829 m)   Wt 65.8 kg   SpO2 97%   BMI 19.67 kg/m   Physical Exam Vitals and nursing note reviewed.  Constitutional:      General: He is not in acute distress.    Appearance: Normal appearance. He is not ill-appearing or toxic-appearing.  Cardiovascular:     Pulses: Normal pulses.  Pulmonary:     Effort: Pulmonary effort is normal.  Musculoskeletal:        General: No swelling or tenderness.     Comments: Laceration at the distal left index finger with 4 sutures in place.  Wound appears to be healing well.  No edema erythema or drainage.  No wound dehiscence.  He has full range of motion of the finger, gross sensation to finger intact.  Skin:    General: Skin is warm.     Capillary Refill: Capillary refill takes less than 2 seconds.     Findings: No erythema.  Neurological:     General: No focal deficit present.  Mental Status: He is alert.     Sensory: No sensory deficit.     Motor: No weakness.     (all labs ordered are listed, but only abnormal results are displayed) Labs Reviewed - No data to display  EKG: None  Radiology: No results found.   Procedures     SUTURE REMOVAL Performed by: Avaneesh Pepitone  Consent: Verbal consent obtained. Patient identity confirmed: provided demographic data Time out: Immediately prior to procedure a time out was called to verify the correct patient, procedure, equipment, support staff and site/side marked as required.  Location details: left index finger  Wound Appearance: clean  Sutures/Staples Removed: 4  Facility: sutures placed in this facility Patient tolerance:  Patient tolerated the procedure well with no immediate complications.   Medications Ordered in the ED - No data to display                                  Medical Decision Making    Patient here requesting suture removal laceration to his distal left index finger.  Sutures were placed 7 days ago.  He denies any complications and states the wound has been healing well.  He is requesting note to return to work.  On my exam, wound appears to be healing well no signs of infection.  Neurovascularly intact.  He has full range of motion of the finger  4 sutures were removed successfully by me without complication.  Patient counseled on continued wound care and to avoid excessive use or heavy lifting with his left hand until wound is completely healed.        Final diagnoses:  Visit for suture removal    ED Discharge Orders     None          Herlinda Milling, PA-C 08/13/24 1754    Cleotilde Rogue, MD 08/15/24 1240

## 2024-08-13 NOTE — ED Notes (Signed)
 Pt/family received d/c paperwork at this time. After going over the paperwork any questions, comments, or concerns were answered to the best of this nurse's knowledge. The pt/family verbally acknowledged the teachings/instructions.

## 2024-08-13 NOTE — ED Triage Notes (Signed)
 Pt here for suture removal to left index finger.  Denies any issues to site.

## 2024-08-13 NOTE — Discharge Instructions (Addendum)
 Return if needed

## 2024-09-21 ENCOUNTER — Other Ambulatory Visit: Payer: Self-pay

## 2024-09-21 ENCOUNTER — Encounter (HOSPITAL_COMMUNITY): Payer: Self-pay | Admitting: *Deleted

## 2024-09-21 ENCOUNTER — Emergency Department (HOSPITAL_COMMUNITY): Admission: EM | Admit: 2024-09-21 | Discharge: 2024-09-21 | Disposition: A | Discharge status: Home / Self Care

## 2024-09-21 DIAGNOSIS — J101 Influenza due to other identified influenza virus with other respiratory manifestations: Secondary | ICD-10-CM | POA: Diagnosis not present

## 2024-09-21 DIAGNOSIS — Z79899 Other long term (current) drug therapy: Secondary | ICD-10-CM | POA: Diagnosis not present

## 2024-09-21 DIAGNOSIS — R059 Cough, unspecified: Secondary | ICD-10-CM | POA: Diagnosis present

## 2024-09-21 LAB — RESP PANEL BY RT-PCR (RSV, FLU A&B, COVID)  RVPGX2
Influenza A by PCR: NEGATIVE
Influenza B by PCR: POSITIVE — AB
Resp Syncytial Virus by PCR: NEGATIVE
SARS Coronavirus 2 by RT PCR: NEGATIVE

## 2024-09-21 MED ORDER — ONDANSETRON 4 MG PO TBDP
4.0000 mg | ORAL_TABLET | Freq: Once | ORAL | Status: AC
  Administered 2024-09-21: 4 mg via ORAL
  Filled 2024-09-21: qty 1

## 2024-09-21 MED ORDER — ACETAMINOPHEN 500 MG PO TABS
1000.0000 mg | ORAL_TABLET | Freq: Once | ORAL | Status: AC
  Administered 2024-09-21: 1000 mg via ORAL
  Filled 2024-09-21: qty 2

## 2024-09-21 MED ORDER — ONDANSETRON HCL 4 MG PO TABS: 4.0000 mg | ORAL_TABLET | Freq: Three times a day (TID) | ORAL | 0 refills | Status: AC | PRN

## 2024-09-21 NOTE — ED Notes (Signed)
 Pt requested something to help with headache. PA made aware

## 2024-09-21 NOTE — ED Triage Notes (Signed)
 Pt with cough, chills, face pressure and runny nose started yesterday. Emesis today x 2.

## 2024-09-21 NOTE — ED Provider Notes (Signed)
 " [place] EMERGENCY DEPARTMENT AT [facility] Provider Note   CSN: [id number] Arrival date & time: 09/21/24  1612     Patient presents with: Chills   Mario Bautista is a 44 y.o. male with history significant for pneumothorax presenting for flulike symptoms including nonproductive cough, chills and bodyaches, nasal congestion with clear rhinorrhea.  He also endorses having emesis x 2 today, last occurring around noon today.  He also had an episode of diarrhea this morning but none since.  He did take a [facility] brand NyQuil type combination product this afternoon for symptom relief.  He has been exposed to others with similar symptoms with his job.   The history is provided by the patient.       Prior to Admission medications  Medication Sig Start Date End Date Taking? Authorizing Provider  ondansetron  (ZOFRAN ) 4 MG tablet Take 1 tablet (4 mg total) by mouth every 8 (eight) hours as needed for nausea or vomiting. 09/21/24  Yes Jevante Hollibaugh, PA-C  celecoxib  (CELEBREX ) 200 MG capsule Take 1 capsule (200 mg total) by mouth 2 (two) times daily. 03/04/23   Harris, Abigail, PA-C  cetirizine -pseudoephedrine  (ZYRTEC -D) 5-120 MG tablet Take 1 tablet by mouth daily. 06/09/24   Daralene Lonni BIRCH, PA-C  doxycycline  (VIBRAMYCIN ) 100 MG capsule Take 1 capsule (100 mg total) by mouth 2 (two) times daily. One po bid x 7 days 03/04/23   Harris, Abigail, PA-C  fluticasone  (FLONASE ) 50 MCG/ACT nasal spray Place 2 sprays into both nostrils daily. 06/09/24   Daralene Lonni BIRCH, PA-C  ondansetron  (ZOFRAN -ODT) 4 MG disintegrating tablet 4mg  ODT q4 hours prn nausea/vomit 05/08/22   Zammit, Joseph, MD  oxyCODONE  (ROXICODONE ) 5 MG immediate release tablet Take 0.5-1 tablets (2.5-5 mg total) by mouth every 4 (four) hours as needed for severe pain. 03/04/23   Harris, Abigail, PA-C  oxyCODONE -acetaminophen  (PERCOCET) 5-325 MG tablet Take 1 tablet by mouth every 6 (six) hours as needed. 05/08/22   Zammit,  Joseph, MD  promethazine -dextromethorphan (PROMETHAZINE -DM) 6.25-15 MG/5ML syrup Take 5 mLs by mouth 4 (four) times daily as needed. 06/09/24   Daralene Lonni BIRCH, PA-C  tamsulosin  (FLOMAX ) 0.4 MG CAPS capsule Take 1 capsule (0.4 mg total) by mouth daily. 05/08/22   Suzette Pac, MD    Allergies: Penicillins and Imitrex [sumatriptan base]    Review of Systems  Constitutional:  Positive for chills and fever.  HENT:  Positive for congestion and rhinorrhea. Negative for ear pain, sinus pressure, sore throat, trouble swallowing and voice change.   Eyes:  Negative for discharge.  Respiratory:  Positive for cough. Negative for shortness of breath, wheezing and stridor.   Cardiovascular:  Negative for chest pain.  Gastrointestinal:  Positive for diarrhea, nausea and vomiting. Negative for abdominal pain.  Genitourinary: Negative.     Updated Vital Signs BP (!) 151/93 (BP Location: Right Arm)   Pulse 87   Temp 99.3 F (37.4 C) (Oral)   Resp 16   Ht 6' (1.829 m)   Wt 68 kg   SpO2 99%   BMI 20.34 kg/m   Physical Exam Vitals and nursing note reviewed.  Constitutional:      Appearance: He is well-developed.  HENT:     Head: Normocephalic and atraumatic.     Mouth/Throat:     Mouth: Mucous membranes are moist.  Eyes:     Conjunctiva/sclera: Conjunctivae normal.  Cardiovascular:     Rate and Rhythm: Normal rate and regular rhythm.     Heart  sounds: Normal heart sounds.  Pulmonary:     Effort: Pulmonary effort is normal.     Breath sounds: Normal breath sounds. No wheezing or rhonchi.  Abdominal:     General: Bowel sounds are normal.     Palpations: Abdomen is soft.     Tenderness: There is no abdominal tenderness. There is no guarding or rebound.  Musculoskeletal:        General: Normal range of motion.     Cervical back: Normal range of motion.  Skin:    General: Skin is warm and dry.  Neurological:     General: No focal deficit present.     Mental Status: He is alert.      (all labs ordered are listed, but only abnormal results are displayed) Labs Reviewed  RESP PANEL BY RT-PCR (RSV, FLU A&B, COVID)  RVPGX2 - Abnormal; Notable for the following components:      Result Value   Influenza B by PCR POSITIVE (*)    All other components within normal limits    EKG: None  Radiology: No results found.   Procedures   Medications Ordered in the ED  acetaminophen  (TYLENOL ) tablet 1,000 mg (1,000 mg Oral Given 09/21/24 2058)  ondansetron  (ZOFRAN -ODT) disintegrating tablet 4 mg (4 mg Oral Given 09/21/24 2109)                                    Medical Decision Making Patient presenting with flulike symptoms, differential diagnosis would include influenza, viral URI, he also has had nausea vomiting and diarrhea raising concern for gastroenteritis, no significant abdominal pain or distention, pneumonia, bronchitis.  His exam is reassuring with normal breath sounds all lung fields, abdomen is soft and nontender.  He is positive for influenza B.  Patient was able to tolerate p.o. intake prior to discharge home.  He was given instructions for home care, return precautions outlined.  Amount and/or Complexity of Data Reviewed Labs: ordered.    Details: Aspiratory panel positive for influenza B  Risk OTC drugs. Prescription drug management.        Final diagnoses:  Influenza B    ED Discharge Orders          Ordered    ondansetron  (ZOFRAN ) 4 MG tablet  Every 8 hours PRN        09/21/24 2110               Kortnee Bas, PA-C 09/21/24 2110  "

## 2024-09-21 NOTE — ED Notes (Signed)
 Pt provided water per PA request

## 2024-09-21 NOTE — Discharge Instructions (Signed)
 Rest make sure you are drinking plenty of fluids.  I have prescribed you Zofran  which is a medicine that should help you if your nausea returns.  Continue treating your fever and bodyaches with Tylenol  or Motrin  per label instructions.  Get rechecked if you develop any severe weakness or shortness of breath.  Expect the fluid to last around 7 days on average.  You will need to stay home and away from others until you have been fever free for 24 hours.
